# Patient Record
Sex: Male | Born: 1955 | Race: White | Hispanic: No | Smoking: Never smoker
Health system: Southern US, Community
[De-identification: ages and names within clinical notes are randomized; demographics above are authoritative.]

## PROBLEM LIST (undated history)

## (undated) DIAGNOSIS — I219 Acute myocardial infarction, unspecified: Secondary | ICD-10-CM

## (undated) HISTORY — PX: CHOLECYSTECTOMY: SHX55

## (undated) HISTORY — PX: TONSILLECTOMY: SUR1361

## (undated) HISTORY — PX: APPENDECTOMY: SHX54

---

## 2013-10-20 HISTORY — PX: CARDIAC CATHETERIZATION: SHX172

## 2019-11-03 ENCOUNTER — Other Ambulatory Visit: Payer: Self-pay

## 2019-11-04 ENCOUNTER — Other Ambulatory Visit: Payer: Self-pay

## 2019-11-09 DIAGNOSIS — E782 Mixed hyperlipidemia: Secondary | ICD-10-CM | POA: Insufficient documentation

## 2019-11-09 DIAGNOSIS — E039 Hypothyroidism, unspecified: Secondary | ICD-10-CM | POA: Insufficient documentation

## 2020-03-05 DIAGNOSIS — I10 Essential (primary) hypertension: Secondary | ICD-10-CM | POA: Insufficient documentation

## 2020-08-21 DIAGNOSIS — Z0000 Encounter for general adult medical examination without abnormal findings: Secondary | ICD-10-CM | POA: Insufficient documentation

## 2020-09-17 ENCOUNTER — Ambulatory Visit: Payer: Self-pay

## 2020-09-24 ENCOUNTER — Ambulatory Visit: Payer: Self-pay

## 2020-11-12 ENCOUNTER — Other Ambulatory Visit: Payer: Self-pay

## 2021-02-13 DIAGNOSIS — I25119 Atherosclerotic heart disease of native coronary artery with unspecified angina pectoris: Secondary | ICD-10-CM | POA: Insufficient documentation

## 2021-02-13 DIAGNOSIS — M545 Low back pain, unspecified: Secondary | ICD-10-CM | POA: Insufficient documentation

## 2021-02-13 DIAGNOSIS — G4733 Obstructive sleep apnea (adult) (pediatric): Secondary | ICD-10-CM | POA: Insufficient documentation

## 2021-02-13 DIAGNOSIS — K219 Gastro-esophageal reflux disease without esophagitis: Secondary | ICD-10-CM | POA: Insufficient documentation

## 2021-02-13 DIAGNOSIS — I2510 Atherosclerotic heart disease of native coronary artery without angina pectoris: Secondary | ICD-10-CM | POA: Insufficient documentation

## 2021-02-17 ENCOUNTER — Encounter: Payer: Self-pay | Admitting: Emergency Medicine

## 2021-02-17 ENCOUNTER — Emergency Department: Payer: BC Managed Care – PPO

## 2021-02-17 ENCOUNTER — Other Ambulatory Visit: Payer: Self-pay

## 2021-02-17 ENCOUNTER — Emergency Department
Admission: EM | Admit: 2021-02-17 | Discharge: 2021-02-17 | Disposition: A | Discharge status: Home / Self Care | Payer: BC Managed Care – PPO | Attending: Emergency Medicine | Admitting: Emergency Medicine

## 2021-02-17 DIAGNOSIS — E876 Hypokalemia: Secondary | ICD-10-CM | POA: Diagnosis not present

## 2021-02-17 DIAGNOSIS — I10 Essential (primary) hypertension: Secondary | ICD-10-CM | POA: Diagnosis not present

## 2021-02-17 DIAGNOSIS — R0602 Shortness of breath: Secondary | ICD-10-CM | POA: Diagnosis present

## 2021-02-17 DIAGNOSIS — U071 COVID-19: Secondary | ICD-10-CM | POA: Diagnosis not present

## 2021-02-17 DIAGNOSIS — I700 Atherosclerosis of aorta: Secondary | ICD-10-CM | POA: Insufficient documentation

## 2021-02-17 DIAGNOSIS — R911 Solitary pulmonary nodule: Secondary | ICD-10-CM

## 2021-02-17 HISTORY — DX: Acute myocardial infarction, unspecified: I21.9

## 2021-02-17 LAB — CBC WITH DIFFERENTIAL/PLATELET
Abs Immature Granulocytes: 0.01 10*3/uL (ref 0.00–0.07)
Basophils Absolute: 0 10*3/uL (ref 0.0–0.1)
Basophils Relative: 1 %
Eosinophils Absolute: 0.1 10*3/uL (ref 0.0–0.5)
Eosinophils Relative: 3 %
HCT: 47.4 % (ref 39.0–52.0)
Hemoglobin: 15.6 g/dL (ref 13.0–17.0)
Immature Granulocytes: 0 %
Lymphocytes Relative: 20 %
Lymphs Abs: 0.8 10*3/uL (ref 0.7–4.0)
MCH: 27.1 pg (ref 26.0–34.0)
MCHC: 32.9 g/dL (ref 30.0–36.0)
MCV: 82.4 fL (ref 80.0–100.0)
Monocytes Absolute: 0.4 10*3/uL (ref 0.1–1.0)
Monocytes Relative: 8 %
Neutro Abs: 2.8 10*3/uL (ref 1.7–7.7)
Neutrophils Relative %: 68 %
Platelets: 148 10*3/uL — ABNORMAL LOW (ref 150–400)
RBC: 5.75 MIL/uL (ref 4.22–5.81)
RDW: 16.7 % — ABNORMAL HIGH (ref 11.5–15.5)
WBC: 4.2 10*3/uL (ref 4.0–10.5)
nRBC: 0 % (ref 0.0–0.2)

## 2021-02-17 LAB — BASIC METABOLIC PANEL
Anion gap: 9 (ref 5–15)
BUN: 11 mg/dL (ref 8–23)
CO2: 24 mmol/L (ref 22–32)
Calcium: 8.4 mg/dL — ABNORMAL LOW (ref 8.9–10.3)
Chloride: 102 mmol/L (ref 98–111)
Creatinine, Ser: 1.07 mg/dL (ref 0.61–1.24)
GFR, Estimated: >60 mL/min (ref >60)
Glucose, Bld: 99 mg/dL (ref 70–99)
Potassium: 3.4 mmol/L — ABNORMAL LOW (ref 3.5–5.1)
Sodium: 135 mmol/L (ref 135–145)

## 2021-02-17 LAB — TROPONIN I (HIGH SENSITIVITY): Troponin I (High Sensitivity): 7 ng/L (ref <18)

## 2021-02-17 LAB — D-DIMER, QUANTITATIVE: D-Dimer, Quant: 0.63 ug/mL-FEU — ABNORMAL HIGH (ref 0.00–0.50)

## 2021-02-17 LAB — PROCALCITONIN: Procalcitonin: <0.1 ng/mL

## 2021-02-17 LAB — MAGNESIUM: Magnesium: 2 mg/dL (ref 1.7–2.4)

## 2021-02-17 IMAGING — CT CT ANGIO CHEST
2 of 6 series · 18 of 46 positions shown · IV contrast (APPLIED)
Comparison: No comparison studies available.

CLINICAL DATA: Shortness of breath.

EXAM:
CT ANGIOGRAPHY CHEST WITH CONTRAST
TECHNIQUE: Multidetector CT imaging of the chest was performed using the
standard protocol during bolus administration of intravenous
contrast. Multiplanar CT image reconstructions and MIPs were
obtained to evaluate the vascular anatomy.
CONTRAST:  100mL OMNIPAQUE IOHEXOL 350 MG/ML SOLN

[Series 6: thins · axial · 0.73mm/px · z∈[-241,+32]mm · 15 of 300 slices shown]
[im 14/300  lung]
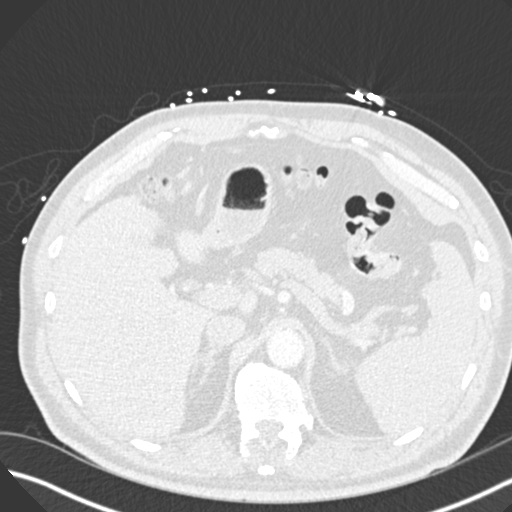
[im 40/300  soft-tissue]
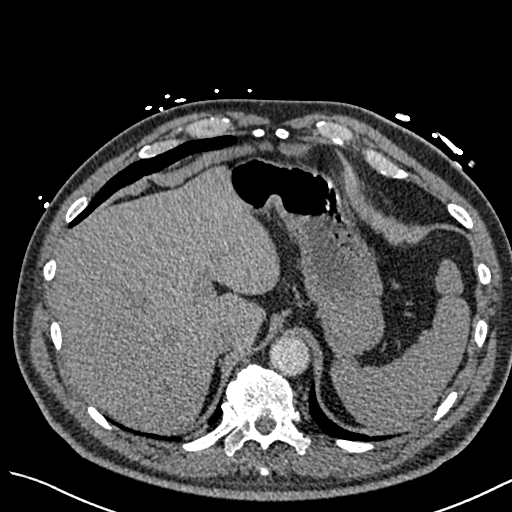
[im 53/300  lung]
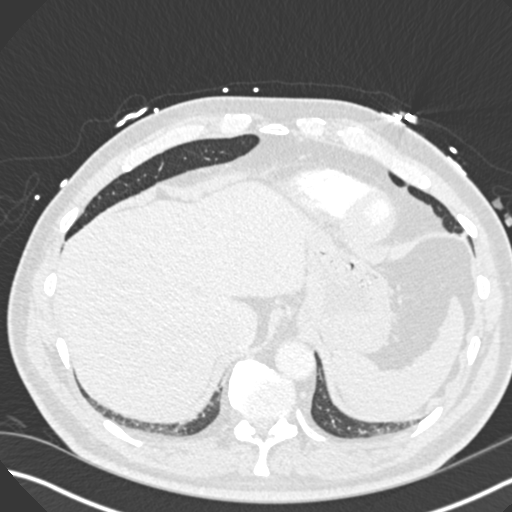
[im 79/300  soft-tissue]
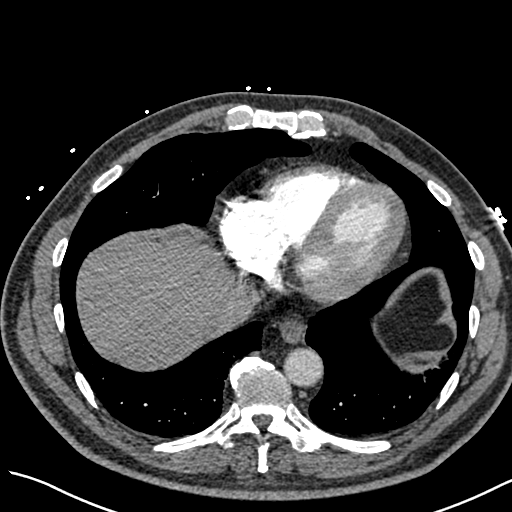
[im 92/300  lung]
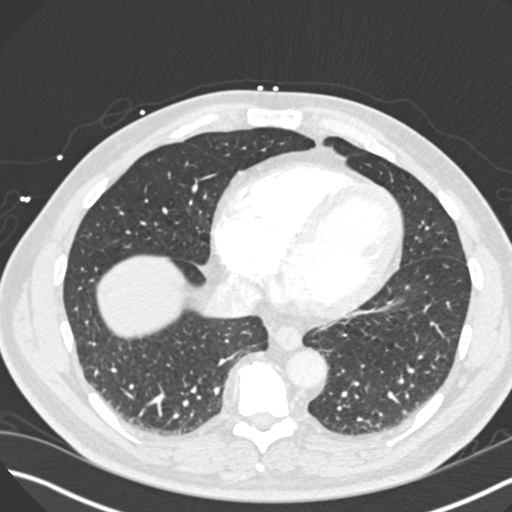
[im 118/300  soft-tissue]
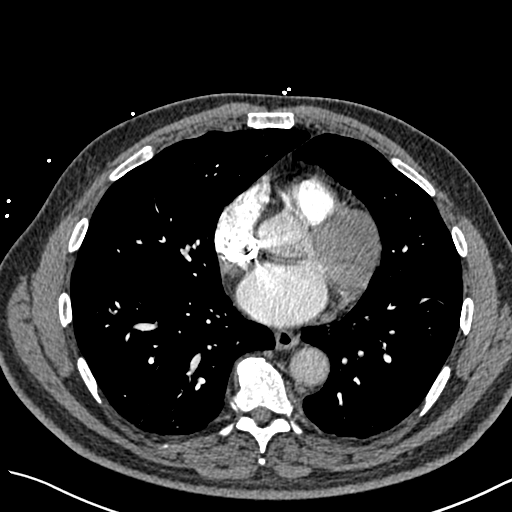
[im 131/300  lung]
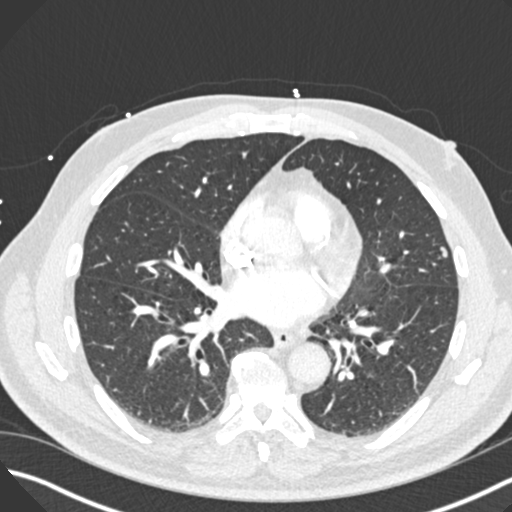
[im 157/300  soft-tissue]
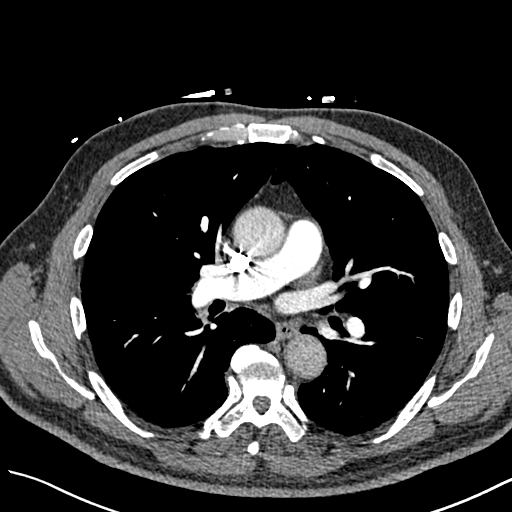
[im 170/300  lung]
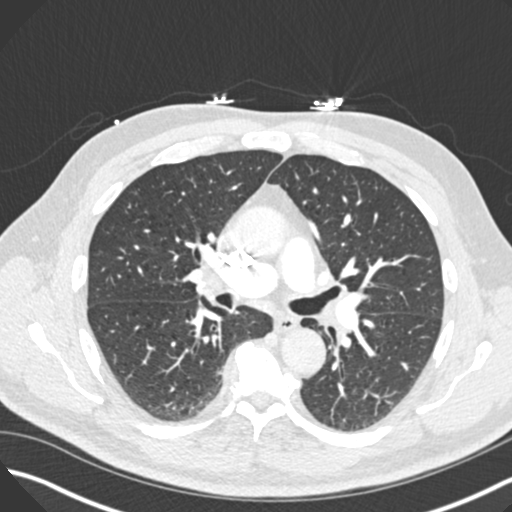
[im 183/300  soft-tissue]
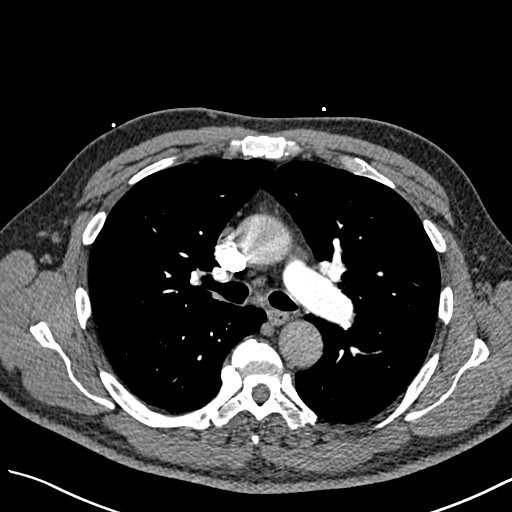
[im 209/300  lung]
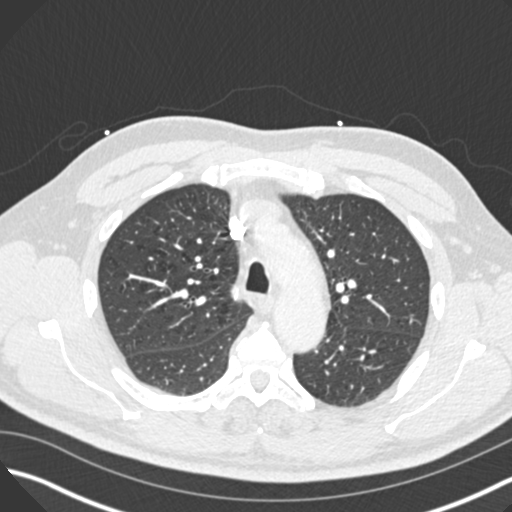
[im 222/300  soft-tissue]
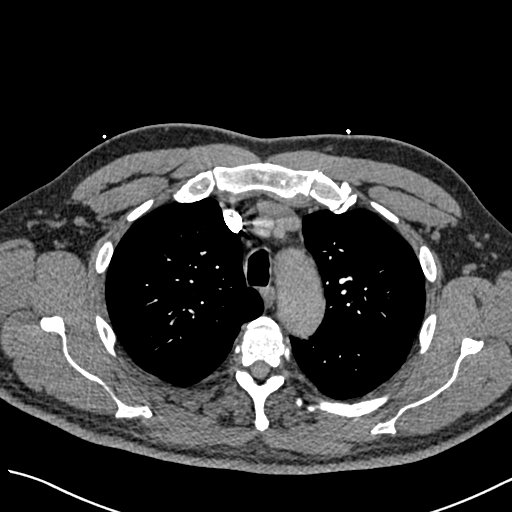
[im 248/300  lung]
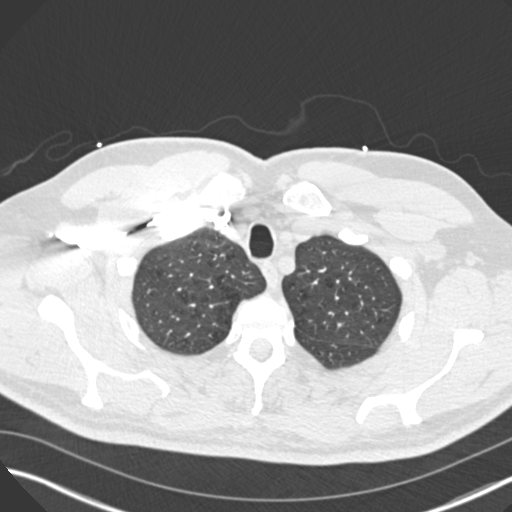
[im 261/300  soft-tissue]
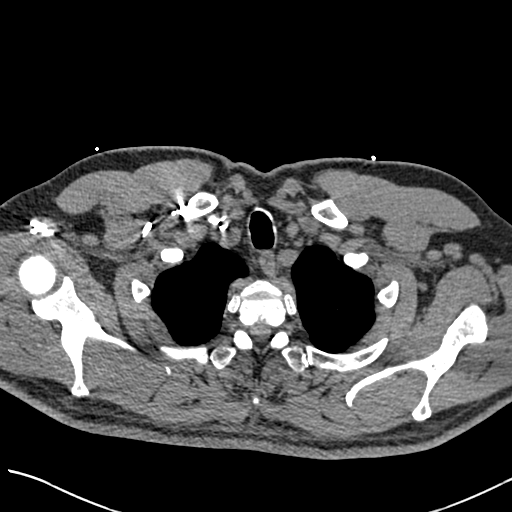
[im 287/300  lung]
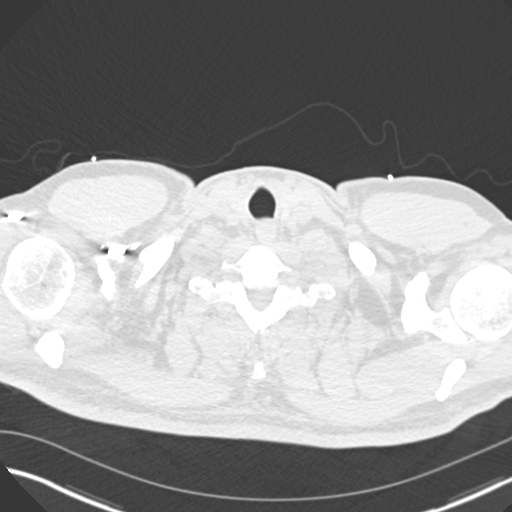

[Series 8: coronal mpr · coronal · 0.59mm/px · 3 of 99 slices shown]
[im 25/99  soft-tissue]
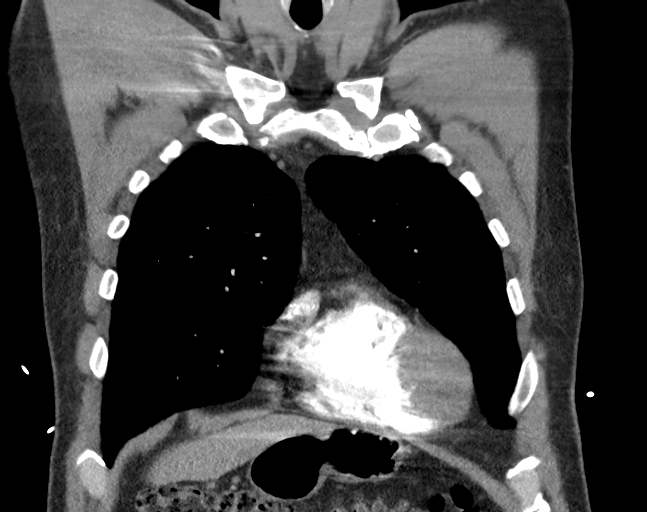
[im 50/99  soft-tissue]
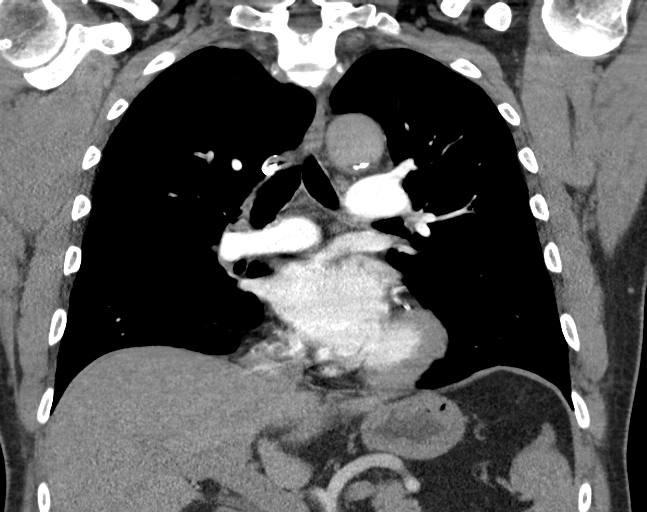
[im 74/99  soft-tissue]
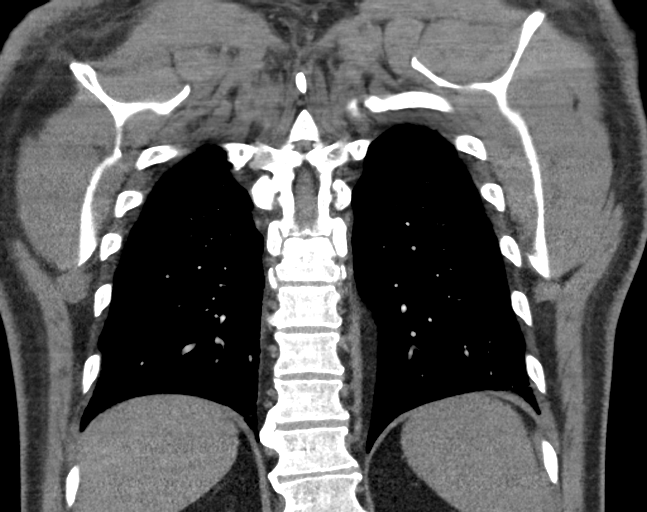

[18 of 46 positions shown; findings below may reference images not displayed]

FINDINGS: Cardiovascular: The heart size is normal. No substantial pericardial
effusion. Coronary artery calcification is evident. Atherosclerotic
calcification is noted in the wall of the thoracic aorta. There is
no filling defect within the opacified pulmonary arteries to suggest
the presence of an acute pulmonary embolus.

Mediastinum/Nodes: No mediastinal lymphadenopathy. There is no hilar
lymphadenopathy. The esophagus has normal imaging features. There is
no axillary lymphadenopathy.

Lungs/Pleura: Centrilobular emphsyema noted. 8 mm left upper lobe
nodule identified on image 57/7. Mixed attenuation left lower lobe
ground-glass nodule measuring 11 mm visible on 47/7. No focal
airspace consolidation. No pleural effusion.

Upper Abdomen: Unremarkable.

Musculoskeletal: No worrisome lytic or sclerotic osseous
abnormality.

Review of the MIP images confirms the above findings.
IMPRESSION: 1. No CT evidence for acute pulmonary embolus.
2. 8 mm left upper lobe pulmonary nodule with 11 mm mixed
attenuation left lower lobe ground-glass nodule. Non-contrast chest
CT at 6-12 months is recommended. If the left upper lobe nodule is
stable at time of repeat CT, then future CT at 18-24 months (from
today's scan) is considered optional for low-risk patients, but is
recommended for high-risk patients. Follow-up for the mixed
attenuation ground-glass nodule can be determined at the time of
initial follow-up study. This recommendation follows the consensus
statement: Guidelines for Management of Incidental Pulmonary Nodules
Detected on CT Images: From the [HOSPITAL] [5B]; Radiology
3. Aortic Atherosclerosis ([5B]-[5B]) and Emphysema ([5B]-[5B]).

## 2021-02-17 MED ORDER — ACETAMINOPHEN 500 MG PO TABS
1000.0000 mg | ORAL_TABLET | Freq: Once | ORAL | Status: AC
  Administered 2021-02-17: 1000 mg via ORAL
  Filled 2021-02-17: qty 2

## 2021-02-17 MED ORDER — IOHEXOL 350 MG/ML SOLN
100.0000 mL | Freq: Once | INTRAVENOUS | Status: AC | PRN
  Administered 2021-02-17: 100 mL via INTRAVENOUS

## 2021-02-17 MED ORDER — POTASSIUM CHLORIDE CRYS ER 20 MEQ PO TBCR
40.0000 meq | EXTENDED_RELEASE_TABLET | Freq: Once | ORAL | Status: AC
  Administered 2021-02-17: 40 meq via ORAL
  Filled 2021-02-17: qty 2

## 2021-02-17 MED ORDER — IBUPROFEN 400 MG PO TABS
400.0000 mg | ORAL_TABLET | Freq: Once | ORAL | Status: AC
  Administered 2021-02-17: 400 mg via ORAL
  Filled 2021-02-17: qty 1

## 2021-02-17 NOTE — ED Notes (Signed)
IV catheter removed intact without complication.  D/C instructions given.  Instructed on OTC med usage and follow up  per d/c orders.  All questions addressed.  Understanding verbalized. Pt left ER ambulatory with steady gait.

## 2021-02-17 NOTE — Discharge Instructions (Addendum)
Your Chest CT today showed: 1. No CT evidence for acute pulmonary embolus. 2. 8 mm left upper lobe pulmonary nodule with 11 mm mixed attenuation left lower lobe ground-glass nodule. Non-contrast chest CT at 6-12 months is recommended. If the left upper lobe nodule is stable at time of repeat CT, then future CT at 18-24 months (from today's scan) is considered optional for low-risk patients, but is recommended for high-risk patients. Follow-up for the mixed attenuation ground-glass nodule can be determined at the time of initial follow-up study. This recommendation follows the consensus statement: Guidelines for Management of Incidental Pulmonary Nodules Detected on CT Images: From the Fleischner Society 2017; Radiology 2017; 284:228-243. 3. Aortic Atherosclerosis (ICD10-I70.0) and Emphysema (ICD10-J43.9).

## 2021-02-17 NOTE — ED Provider Notes (Signed)
[facility] Emergency Department Provider Note  ____________________________________________   Event Date/Time   First MD Initiated Contact with Patient 02/17/21 [phone]     (approximate)  I have reviewed the triage vital signs and the nursing notes.   HISTORY  Chief Complaint Shortness of Breath and Covid Positive   HPI Jesse May is a 65 y.o. male with a past medical history of CAD, HDL and hypothyroidism who presents for assessment approximately 4 days of cough, shortness of breath, myalgias, headache, chills and some chest pain.  He describes his pain as tightness.  He states he was tested positive at urgent care on 4/28 and was told he would be better by now but feels he is not getting better.  He does not smoke and has been vaccinated.  He denies any rashes, nausea, vomiting, diarrhea, dysuria, abdominal pain, earache, sore throat, vision changes, recent falls or injuries or any other acute sick symptoms.  Has not taken any medicines for his symptoms today.         Past Medical History:  Diagnosis Date  . MI (myocardial infarction) (HCC)     There are no problems to display for this patient.     Prior to Admission medications   Not on File    Allergies Patient has no known allergies.  No family history on file.  Social History Social History   Tobacco Use  . Smoking status: Never Smoker  . Smokeless tobacco: Never Used  Substance Use Topics  . Alcohol use: Not Currently  . Drug use: Not Currently    Review of Systems  Review of Systems  Constitutional: Positive for malaise/fatigue. Negative for chills and fever.  HENT: Negative for sore throat.   Eyes: Negative for pain.  Respiratory: Positive for cough and shortness of breath. Negative for stridor.   Cardiovascular: Positive for chest pain.  Gastrointestinal: Negative for vomiting.  Genitourinary: Negative for dysuria.  Musculoskeletal: Positive for myalgias.  Skin:  Negative for rash.  Neurological: Negative for seizures, loss of consciousness and headaches.  Psychiatric/Behavioral: Negative for suicidal ideas.  All other systems reviewed and are negative.     ____________________________________________   PHYSICAL EXAM:  VITAL SIGNS: ED Triage Vitals  Enc Vitals Group     BP      Pulse      Resp      Temp      Temp src      SpO2      Weight      Height      Head Circumference      Peak Flow      Pain Score      Pain Loc      Pain Edu?      Excl. in GC?    Vitals:   02/17/21 0909 02/17/21 1015  BP: (!) 161/88 (!) 166/90  Pulse: 76 68  Resp: 18 18  Temp: 98.5 F (36.9 C)   SpO2: 100% 98%   Physical Exam Vitals and nursing note reviewed.  Constitutional:      Appearance: He is well-developed.  HENT:     Head: Normocephalic and atraumatic.     Right Ear: External ear normal.     Left Ear: External ear normal.     Nose: Nose normal.     Mouth/Throat:     Mouth: Mucous membranes are moist.  Eyes:     Conjunctiva/sclera: Conjunctivae normal.  Cardiovascular:     Rate and Rhythm: Normal  rate and regular rhythm.     Pulses: Normal pulses.     Heart sounds: No murmur heard.   Pulmonary:     Effort: Pulmonary effort is normal. No respiratory distress.     Breath sounds: Normal breath sounds.  Abdominal:     Palpations: Abdomen is soft.     Tenderness: There is no abdominal tenderness.  Musculoskeletal:     Cervical back: Neck supple.     Right lower leg: No edema.     Left lower leg: No edema.  Skin:    General: Skin is warm and dry.     Capillary Refill: Capillary refill takes less than 2 seconds.  Neurological:     Mental Status: He is alert and oriented to person, place, and time.  Psychiatric:        Mood and Affect: Mood normal.      ____________________________________________   LABS (all labs ordered are listed, but only abnormal results are displayed)  Labs Reviewed  CBC WITH DIFFERENTIAL/PLATELET  - Abnormal; Notable for the following components:      Result Value   RDW 16.7 (*)    Platelets 148 (*)    All other components within normal limits  BASIC METABOLIC PANEL - Abnormal; Notable for the following components:   Potassium 3.4 (*)    Calcium 8.4 (*)    All other components within normal limits  D-DIMER, QUANTITATIVE - Abnormal; Notable for the following components:   D-Dimer, Quant 0.63 (*)    All other components within normal limits  PROCALCITONIN  MAGNESIUM  TROPONIN I (HIGH SENSITIVITY)   ____________________________________________  EKG  Sinus rhythm with a ventricular rate of 75, incomplete left bundle branch block, otherwise unremarkable intervals without clear evidence of acute ischemia or significant underlying arrhythmia. ____________________________________________  RADIOLOGY  ED MD interpretation: Chest x-ray is unremarkable.  CTA chest shows no evidence of PE, full consolidation, overt edema, no thorax or other clear acute intrathoracic abnormalities.  There is evidence of nodule not previously seen and aortic atherosclerosis.  There is also evidence of some emphysema.  Official radiology report(s): DG Chest 2 View  Result Date: 02/17/2021 CLINICAL DATA:  Patient recently tested positive for COVID-19. Shortness of breath. EXAM: CHEST - 2 VIEW COMPARISON:  None. FINDINGS: The heart size and mediastinal contours are within normal limits. Both lungs are clear. The visualized skeletal structures are unremarkable. IMPRESSION: No active cardiopulmonary disease. Electronically Signed   By: Gerome Sam III M.D   On: 02/17/2021 09:43   CT Angio Chest PE W and/or Wo Contrast  Result Date: 02/17/2021 CLINICAL DATA:  Shortness of breath. EXAM: CT ANGIOGRAPHY CHEST WITH CONTRAST TECHNIQUE: Multidetector CT imaging of the chest was performed using the standard protocol during bolus administration of intravenous contrast. Multiplanar CT image reconstructions and MIPs were  obtained to evaluate the vascular anatomy. CONTRAST:  OMNIPAQUE IOHEXOL 350 MG/ML SOLN COMPARISON:  No comparison studies available. FINDINGS: Cardiovascular: The heart size is normal. No substantial pericardial effusion. Coronary artery calcification is evident. Atherosclerotic calcification is noted in the wall of the thoracic aorta. There is no filling defect within the opacified pulmonary arteries to suggest the presence of an acute pulmonary embolus. Mediastinum/Nodes: No mediastinal lymphadenopathy. There is no hilar lymphadenopathy. The esophagus has normal imaging features. There is no axillary lymphadenopathy. Lungs/Pleura: Centrilobular emphsyema noted. 8 mm left upper lobe nodule identified on image 57/7. Mixed attenuation left lower lobe ground-glass nodule measuring 11 mm visible on 47/7. No focal airspace  consolidation. No pleural effusion. Upper Abdomen: Unremarkable. Musculoskeletal: No worrisome lytic or sclerotic osseous abnormality. Review of the MIP images confirms the above findings. IMPRESSION: 1. No CT evidence for acute pulmonary embolus. 2. 8 mm left upper lobe pulmonary nodule with 11 mm mixed attenuation left lower lobe ground-glass nodule. Non-contrast chest CT at 6-12 months is recommended. If the left upper lobe nodule is stable at time of repeat CT, then future CT at 18-24 months (from today's scan) is considered optional for low-risk patients, but is recommended for high-risk patients. Follow-up for the mixed attenuation ground-glass nodule can be determined at the time of initial follow-up study. This recommendation follows the consensus statement: Guidelines for Management of Incidental Pulmonary Nodules Detected on CT Images: From the Fleischner Society 2017; Radiology 2017; 284:228-243. 3. Aortic Atherosclerosis (ICD10-I70.0) and Emphysema (ICD10-J43.9). Electronically Signed   By: Kennith CenterEric  Mansell M.D.   On: 02/17/2021 10:14     ____________________________________________   PROCEDURES  Procedure(s) performed (including Critical Care):  .1-3 Lead EKG Interpretation Performed by: Gilles ChiquitoSmith, Jerrid Forgette P, MD Authorized by: Gilles ChiquitoSmith, Shanan Mcmiller P, MD     Interpretation: normal     ECG rate assessment: normal     Rhythm: sinus rhythm     Ectopy: none     Conduction: normal       ____________________________________________   INITIAL IMPRESSION / ASSESSMENT AND PLAN / ED COURSE      Patient presents with above-stated history exam for assessment approximately 4 days of persistent cough, shortness of breath and chest pain after recent diagnosis of COVID-19 with patient being told by urgent care he should be better by now.  On arrival he is slightly hypertensive with a BP of 161/88 with otherwise stable vital signs on room air.  Differential includes persistent COVID-19 symptoms, ACS, myocarditis, pericarditis, PE, pleurisy, bacterial pneumonia, metabolic derangements and symptomatic anemia.  ECG shows no clear evidence of ischemia and given nonelevated troponin obtained.  3 hours after symptom onset Evalose patient for ACS or myocarditis.  CBC and BMP are unremarkable with the exception of K which is 3.4.  This was repleted.  Patient is WNL.  Pro-Cal is undetectable.  D-dimer is elevated 0.63 had a CTA obtained to rule out PE which does not show evidence of PE but does show findings consistent with COVID-pneumonia and a lung nodule which I advised patient of and recommendation to follow-up with his PCP who can order surveillance imaging as indicated.  Given otherwise reassuring exam and work-up with no evidence of hypoxia I think patient safe for discharge with outpatient follow-up.  Advised isolation for 10 days.  COVID referral placed.  Advised patient his blood pressure was elevated today and he follows up with his PCP in next couple days and recommend titration of his blood pressure medications.  Discharged stable  condition.  Strict return precautions advised and discussed.   ____________________________________________   FINAL CLINICAL IMPRESSION(S) / ED DIAGNOSES  Final diagnoses:  COVID  Hypokalemia  Lung nodule  Hypertension, unspecified type  Aortic atherosclerosis (HCC)    Medications  ibuprofen (ADVIL) tablet 400 mg (has no administration in time range)  acetaminophen (TYLENOL) tablet 1,000 mg (1,000 mg Oral Given 02/17/21 0937)  potassium chloride SA (KLOR-CON) CR tablet 40 mEq (40 mEq Oral Given 02/17/21 1020)  iohexol (OMNIPAQUE) 350 MG/ML injection 100 mL (100 mLs Intravenous Contrast Given 02/17/21 1006)     ED Discharge Orders         Ordered    Ambulatory referral for  Covid Treatment        02/17/21 1031           Note:  This document was prepared using Dragon voice recognition software and may include unintentional dictation errors.   Gilles Chiquito, MD 02/17/21 1034

## 2021-02-17 NOTE — ED Notes (Signed)
Called lab for Mag add-on.

## 2021-02-17 NOTE — ED Triage Notes (Signed)
Pt to ED via POV for shortness of breath. Pt states that he was diagnosed with COVID on Thursday and was told that he should be better by Sunday. Pt states that he is having increased shortness of breath and tightness in his chest. Pt appears to have increased WOB, SpO2 100% on room air

## 2021-02-18 ENCOUNTER — Telehealth: Payer: Self-pay | Admitting: Adult Health

## 2021-02-18 ENCOUNTER — Telehealth: Payer: Self-pay

## 2021-02-18 NOTE — Telephone Encounter (Signed)
Called to discuss with patient about COVID-19 symptoms and the use of one of the available treatments for those with mild to moderate Covid symptoms and at a high risk of hospitalization.  Pt appears to qualify for outpatient treatment due to co-morbid conditions and/or a member of an at-risk group in accordance with the FDA Emergency Use Authorization.    Symptom onset: 4/26 Vaccinated: yes Booster? no Immunocompromised? no Qualifiers: 4 NIH Criteria: 2  Patient declines driving to [place] to receive treatment.  He is outside of the window to receive oral therapy.  Reviewed supportive care and to go to the ER for further urgent needs.   Noreene Filbert

## 2021-02-18 NOTE — Telephone Encounter (Signed)
Called to discuss with patient about COVID-19 symptoms and the use of one of the available treatments for those with mild to moderate Covid symptoms and at a high risk of hospitalization.  Pt appears to qualify for outpatient treatment due to co-morbid conditions and/or a member of an at-risk group in accordance with the FDA Emergency Use Authorization.    Symptom onset: 02/13/21 Cough,shortness of breath,headache,chills Vaccinated: Yes Booster? Unknown Immunocompromised? Yes Qualifiers: CAD,MI NIH Criteria:   Unable to reach pt - Left message and call back number [phone].   Esther Hardy

## 2022-01-07 DIAGNOSIS — G4700 Insomnia, unspecified: Secondary | ICD-10-CM | POA: Insufficient documentation

## 2022-04-08 ENCOUNTER — Other Ambulatory Visit: Payer: Self-pay | Admitting: Nurse Practitioner

## 2022-04-08 DIAGNOSIS — M79661 Pain in right lower leg: Secondary | ICD-10-CM

## 2022-04-09 ENCOUNTER — Other Ambulatory Visit: Payer: Self-pay | Admitting: Nurse Practitioner

## 2022-04-09 DIAGNOSIS — R0600 Dyspnea, unspecified: Secondary | ICD-10-CM

## 2022-04-14 ENCOUNTER — Ambulatory Visit
Admission: RE | Admit: 2022-04-14 | Discharge: 2022-04-14 | Disposition: A | Discharge status: Home / Self Care | Payer: BC Managed Care – PPO | Source: Ambulatory Visit | Attending: Nurse Practitioner | Admitting: Nurse Practitioner

## 2022-04-14 DIAGNOSIS — M79661 Pain in right lower leg: Secondary | ICD-10-CM | POA: Insufficient documentation

## 2022-04-14 DIAGNOSIS — R0600 Dyspnea, unspecified: Secondary | ICD-10-CM | POA: Diagnosis present

## 2022-04-14 LAB — POCT I-STAT CREATININE: Creatinine, Ser: 1.2 mg/dL (ref 0.61–1.24)

## 2022-04-14 MED ORDER — IOHEXOL 350 MG/ML SOLN
75.0000 mL | Freq: Once | INTRAVENOUS | Status: AC | PRN
  Administered 2022-04-14: 75 mL via INTRAVENOUS

## 2022-04-15 ENCOUNTER — Ambulatory Visit
Admission: RE | Admit: 2022-04-15 | Discharge: 2022-04-15 | Disposition: A | Discharge status: Home / Self Care | Payer: BC Managed Care – PPO | Source: Ambulatory Visit | Attending: Nurse Practitioner | Admitting: Nurse Practitioner

## 2022-04-15 DIAGNOSIS — M79661 Pain in right lower leg: Secondary | ICD-10-CM

## 2022-04-15 DIAGNOSIS — R0600 Dyspnea, unspecified: Secondary | ICD-10-CM | POA: Diagnosis not present

## 2022-05-29 ENCOUNTER — Other Ambulatory Visit: Payer: Self-pay | Admitting: Nurse Practitioner

## 2022-05-29 DIAGNOSIS — M542 Cervicalgia: Secondary | ICD-10-CM

## 2022-05-29 DIAGNOSIS — M5440 Lumbago with sciatica, unspecified side: Secondary | ICD-10-CM

## 2022-07-08 ENCOUNTER — Other Ambulatory Visit: Payer: Self-pay | Admitting: Nurse Practitioner

## 2022-07-08 DIAGNOSIS — M549 Dorsalgia, unspecified: Secondary | ICD-10-CM

## 2022-07-15 ENCOUNTER — Ambulatory Visit
Admission: RE | Admit: 2022-07-15 | Discharge: 2022-07-15 | Disposition: A | Discharge status: Home / Self Care | Payer: BC Managed Care – PPO | Source: Ambulatory Visit | Attending: Nurse Practitioner | Admitting: Nurse Practitioner

## 2022-07-15 DIAGNOSIS — M549 Dorsalgia, unspecified: Secondary | ICD-10-CM | POA: Diagnosis not present

## 2022-07-17 DIAGNOSIS — N486 Induration penis plastica: Secondary | ICD-10-CM | POA: Insufficient documentation

## 2022-07-17 DIAGNOSIS — M4802 Spinal stenosis, cervical region: Secondary | ICD-10-CM | POA: Insufficient documentation

## 2022-07-18 ENCOUNTER — Other Ambulatory Visit: Payer: Self-pay | Admitting: Nurse Practitioner

## 2022-07-18 DIAGNOSIS — R937 Abnormal findings on diagnostic imaging of other parts of musculoskeletal system: Secondary | ICD-10-CM

## 2022-07-21 ENCOUNTER — Ambulatory Visit: Payer: BC Managed Care – PPO | Admitting: Urology

## 2022-07-30 ENCOUNTER — Ambulatory Visit
Admission: RE | Admit: 2022-07-30 | Discharge: 2022-07-30 | Disposition: A | Discharge status: Home / Self Care | Payer: BC Managed Care – PPO | Source: Ambulatory Visit | Attending: Nurse Practitioner | Admitting: Nurse Practitioner

## 2022-07-30 DIAGNOSIS — R937 Abnormal findings on diagnostic imaging of other parts of musculoskeletal system: Secondary | ICD-10-CM

## 2022-08-06 ENCOUNTER — Encounter: Payer: Self-pay | Admitting: Urology

## 2022-08-06 ENCOUNTER — Ambulatory Visit (INDEPENDENT_AMBULATORY_CARE_PROVIDER_SITE_OTHER): Payer: BC Managed Care – PPO | Admitting: Urology

## 2022-08-06 ENCOUNTER — Ambulatory Visit: Payer: BC Managed Care – PPO | Admitting: Urology

## 2022-08-06 VITALS — BP 176/80 | HR 80 | Ht 74.0 in | Wt 225.0 lb

## 2022-08-06 DIAGNOSIS — N486 Induration penis plastica: Secondary | ICD-10-CM | POA: Diagnosis not present

## 2022-08-06 MED ORDER — PENTOXIFYLLINE ER 400 MG PO TBCR: 400.0000 mg | EXTENDED_RELEASE_TABLET | Freq: Three times a day (TID) | ORAL | 2 refills | Status: DC

## 2022-08-06 NOTE — [place] Notes (Signed)
   08/06/2022 2:11 PM   Jesse May [birth year] [id number]  Referring provider: Danelle Berry, NP [address] [place],  [place] [address]  Chief Complaint  Patient presents with   Other    HPI: Jesse May is a 66 y.o. male referred for evaluation of Peyronie's disease.  2 month history of painful erection with curvature Notes leftward curvature towards the penile base which he estimates at 30 degrees No hourglass deformity No history of trauma/penile fracture No erectile dysfunction and able to have successful intercourse   PMH: Past Medical History:  Diagnosis Date   MI (myocardial infarction) ([place])     Surgical History: Past Surgical History:  Procedure Laterality Date   APPENDECTOMY     CHOLECYSTECTOMY     TONSILLECTOMY      Home Medications:  Allergies as of 08/06/2022   No Known Allergies      Medication List        Accurate as of August 06, 2022  2:11 PM. If you have any questions, ask your nurse or doctor.          STOP taking these medications    testosterone cypionate 200 MG/ML injection Commonly known as: DEPOTESTOSTERONE CYPIONATE Stopped by: Abbie Sons, MD   traZODone 50 MG tablet Commonly known as: DESYREL Stopped by: Abbie Sons, MD       TAKE these medications    Aspirin 81 MG Caps Aspirin 81 MG Oral Capsule QTY: 0 capsule Days: 0 Refills: 0  Written: 02/07/21 Patient Instructions:   losartan 50 MG tablet Commonly known as: COZAAR Losartan Potassium 50 MG Oral Tablet QTY: 90 tablet Days: 90 Refills: 3  Written: 05/29/22 Patient Instructions: Take 1 tablet by mouth daily in AM for BP        Allergies: No Known Allergies  Family History: No family history on file.  Social History:  reports that he has never smoked. He has never used smokeless tobacco. He reports that he does not currently use alcohol. He reports that he does not currently use drugs.   Physical Exam: BP (!) 176/80   Pulse 80   Ht 6\' 2"   (1.88 m)   Wt 225 lb (102.1 kg)   BMI 28.89 kg/m   Constitutional:  Alert and oriented, No acute distress. HEENT: [place] AT Respiratory: Normal respiratory effort, no increased work of breathing. GU: Phallus circumcised.  Dorsal plaque ~ 1.5 cm between base/mid shaft with some extension to the left lateral corpus.  Stretch penile length normal Skin: No rashes, bruises or suspicious lesions. Psychiatric: Normal mood and affect.   Assessment & Plan:    1.  Peyronie's disease We discussed the acute and chronic phases of Peyronie's disease and that he presently is in the acute phase We discussed surgery or Xiaflex is not recommended for acute phase Peyronie's disease He may have stable or worsening curvature though there are a certain percentage whose curvature will improve or resolve without treatment We discussed multiple off label treatments for Peyronie's disease.  He was interested in a 90-day trial of pentoxifylline Follow-up office visit 4 months   Abbie Sons, [place] [address], [place], [place] [address] [phone]

## 2022-10-01 ENCOUNTER — Encounter: Payer: Self-pay | Admitting: Urology

## 2022-10-01 ENCOUNTER — Ambulatory Visit (INDEPENDENT_AMBULATORY_CARE_PROVIDER_SITE_OTHER): Payer: BC Managed Care – PPO | Admitting: Urology

## 2022-10-01 VITALS — BP 130/80 | HR 74 | Ht 70.0 in | Wt 170.0 lb

## 2022-10-01 DIAGNOSIS — N486 Induration penis plastica: Secondary | ICD-10-CM

## 2022-10-02 ENCOUNTER — Encounter: Payer: Self-pay | Admitting: Urology

## 2022-10-02 NOTE — [place] Notes (Signed)
   10/01/2022 9:09 AM   Berdie Ogren [birth year] [id number]  Referring provider: Fayrene Helper, NP [address] [place],  [place] [address]  Chief Complaint  Patient presents with   Other    HPI: 66 y.o. male referred for follow-up of Peyronie's disease.  Initially seen 08/06/2022 with a 2 month history of painful erection with curvature, left curvature at the base estimated at 30 degrees Given a trial of pentoxifylline as he was still in acute phase though he had difficulty tolerating due to GI side effects. He is interested in pursuing Xiaflex however still having painful erections.  The curvature is stable   PMH: Past Medical History:  Diagnosis Date   MI (myocardial infarction) [facility])     Surgical History: Past Surgical History:  Procedure Laterality Date   APPENDECTOMY     CHOLECYSTECTOMY     TONSILLECTOMY      Home Medications:  Allergies as of 10/01/2022       Reactions   Gabapentin    Note: visual disturbances        Medication List        Accurate as of October 01, 2022 11:59 PM. If you have any questions, ask your nurse or doctor.          Aspirin 81 MG Caps Aspirin 81 MG Oral Capsule QTY: 0 capsule Days: 0 Refills: 0  Written: 02/07/21 Patient Instructions:   losartan 50 MG tablet Commonly known as: COZAAR Losartan Potassium 50 MG Oral Tablet QTY: 90 tablet Days: 90 Refills: 3  Written: 05/29/22 Patient Instructions: Take 1 tablet by mouth daily in AM for BP   pentoxifylline 400 MG CR tablet Commonly known as: TRENTAL Take 1 tablet (400 mg total) by mouth 3 (three) times daily with meals.   rosuvastatin 40 MG tablet Commonly known as: CRESTOR Take 40 mg by mouth daily.        Allergies:  Allergies  Allergen Reactions   Gabapentin     Note: visual disturbances    Family History: No family history on file.  Social History:  reports that he has never smoked. He has never used smokeless tobacco. He reports that he does not  currently use alcohol. He reports that he does not currently use drugs.   Physical Exam: BP 130/80   Pulse 74   Ht 5\' 10"  (1.778 m)   Wt 170 lb (77.1 kg)   BMI 24.39 kg/m   Constitutional:  Alert and oriented, No acute distress. HEENT: [place] AT Respiratory: Normal respiratory effort, no increased work of breathing. Psychiatric: Normal mood and affect.   Assessment & Plan:    1.  Peyronie's disease Still with painful erections and we discussed Xiaflex is not recommended for acute phase Peyronie's disease We reviewed Xiaflex treatment cycle with 2 injections over 1-3 days followed by modeling.  Potential side effects were discussed including pain/bruising and the possibility of corporal rupture which is increased with intercourse within the first 4 weeks of treatment.  We discussed a total of 4 treatment cycles may be needed He is interested in starting the approval process however recommend waiting until acute phase resolves   , MD  [facility] [address], [address] [place], [place] [phone]

## 2022-10-16 ENCOUNTER — Telehealth: Payer: Self-pay | Admitting: Family Medicine

## 2022-10-16 NOTE — Telephone Encounter (Signed)
Patient came by office and signed form. I faxed the form and have received approval. Waiting on medication to be delivered before scheduling for treatment.

## 2022-10-16 NOTE — Telephone Encounter (Signed)
-----   Message from [name], MD sent at 10/02/2022  9:14 AM EST ----- Regarding: Jesse May This is the patient interested in Xiaflex however still having pain with erections and will need to wait until acute phase resolves before his first cycle.  He was interested in going ahead and getting approved

## 2022-12-08 ENCOUNTER — Ambulatory Visit: Payer: Medicare Other | Admitting: Urology

## 2023-01-05 NOTE — Progress Notes (Unsigned)
   01/07/2023 9:01 AM  Jesse May [birth year] [id number]   Referring provider: No referring provider defined for this encounter.   Chief Complaint  Patient presents with   Abnormal Penile Curvature    HPI: Jesse May is a 67 y.o. male who presents today for induction of his erection for plaque marking and curvature measurements in preparation for his Xiaflex injection this afternoon.  The procedure is discussed with patient.  He is allowed to ask questions.  Questions were answered to his satisfaction.    Physical Exam:  Constitutional:  Well nourished. Alert and oriented, No acute distress. GU: No CVA tenderness.  No bladder fullness or masses.  Patient with circumcised phallus.  Urethral meatus is patent.  No penile discharge. No penile lesions or rashes. Scrotum without lesions, cysts, rashes and/or edema.  Psychiatric: Normal mood and affect.   Procedure  Patient's left corpus cavernosum is identified.  An area near the base of the penis is cleansed with rubbing alcohol.  Careful to avoid the dorsal vein, Caverject Impulse 20 mcg 0.5 Lot # [id number] exp # 2025 DEC 31 is injected at a 90 degree angle into the left  corpus cavernosum near the base of the penis.  Patient experienced penile fullness erection in 15 minutes.    Patient's right corpus cavernosum is identified.  An area near the base of the penis is cleansed with rubbing alcohol.  Careful to avoid the dorsal vein, Caverject Impulse 20 mcg 0.5 Lot # [id number] exp # 2025 DEC 31 is injected at a 90 degree angle into the right corpus cavernosum near the base of the penis.  Patient experienced a firm erection in 15 minutes.    Plaque marked.  40 degree dorsal curvature measured.      Assessment & Plan:    1.  Peyronie's disease -return this afternoon for Xiaflex injection      Return for this afternoon .  Jesse May  [place] [address] [place], [place]  [address] [phone]

## 2023-01-07 ENCOUNTER — Ambulatory Visit: Payer: BC Managed Care – PPO | Admitting: Urology

## 2023-01-07 ENCOUNTER — Encounter: Payer: Self-pay | Admitting: Urology

## 2023-01-07 VITALS — BP 164/83 | HR 71 | Ht 74.0 in | Wt 225.0 lb

## 2023-01-07 DIAGNOSIS — N486 Induration penis plastica: Secondary | ICD-10-CM

## 2023-01-07 MED ORDER — COLLAGENASE CLOSTRID HISTOLYT 0.9 MG IJ SOLR
0.9000 mg | Freq: Once | INTRAMUSCULAR | Status: AC
  Administered 2023-01-07: 0.9 mg via INTRAMUSCULAR

## 2023-01-07 NOTE — Progress Notes (Signed)
Xiaflex Injenction  Due to Peyronie's Disease Patient is present today for Xiaflex injection  Medication: Xiaflex 0.58 mg (0.39 ml Sterile diluent, 0.25 ml Reconstituted)   Location:  Lot: [id number] Exp: 10/2024  Patient tolerated well, no complications were noted  Performed by: DR. Bernardo Heater

## 2023-01-07 NOTE — Progress Notes (Signed)
Procedure: Xiaflex injection    Previously marked penile plaque site remained visible with marking pen.  The plaque was easily palpable on the dorsal shaft . Xiaflex injection (0.58 mg) was then injected directly into the plaque at the point of maximal curvature at an angle after  to being warmed to room temperature using a small TB needle.  He tolerated the procedure very well.    - Patient tolerated the procedure well - Discussed no intercourse/masturbation x 4 weeks - Follow-up tomorrow for second injection - Reassured bruising will be normal

## 2023-01-07 NOTE — Progress Notes (Signed)
Xiaflex modeling  Patient's Peyronie's plaque is identified and palpated on the phallus. Wearing gloves, I grasped the plaque about 1 cm proximal and distal to the injection site, making sure to avoid direct pressure on the injection site.  Using the target plaque as a fulcrum point, I used both hands to apply firm, steady pressure to elongate and stretch the plaque.  The penis was bent opposite to the patient's penile curvature, with stretching to the point of moderate resistance. I held pressure for 30 seconds then released. After a 60 second rest period, I repeated the penile modeling technique for a total of 3 modeling attempts at 30 seconds for each attempt.  We discussed penile strengthening and bending exercises at home for the next 6 weeks; instruction sheet provided to the patient. He tolerated well with no concerns.  

## 2023-01-08 ENCOUNTER — Encounter: Payer: Self-pay | Admitting: Urology

## 2023-01-08 ENCOUNTER — Ambulatory Visit (INDEPENDENT_AMBULATORY_CARE_PROVIDER_SITE_OTHER): Payer: BC Managed Care – PPO | Admitting: Urology

## 2023-01-08 VITALS — BP 138/70 | HR 74 | Ht 72.0 in | Wt 225.0 lb

## 2023-01-08 DIAGNOSIS — N486 Induration penis plastica: Secondary | ICD-10-CM

## 2023-01-08 MED ORDER — COLLAGENASE CLOSTRID HISTOLYT 0.9 MG IJ SOLR
0.9000 mg | Freq: Once | INTRAMUSCULAR | Status: AC
  Administered 2023-01-08: 0.9 mg via INTRAMUSCULAR

## 2023-01-08 NOTE — Progress Notes (Signed)
   Procedure: Xiaflex injection    Previously marked penile plaque site  Remained visible with marking pen.  The plaque was easily palpable on the mid shaft . Xiaflex injection (0.58 mg) was then injected directly into the plaque at the point of maximal curvature at an angle after being warmed to room temperature using a small TB needle.  He tolerated the procedure very well.    - Patient tolerated the procedure well -Scheduled for an office modeling tomorrow -At home modeling will be reviewed at tomorrow's visit -No intercourse/masturbation x 6 weeks -Follow-up office visit 6 weeks    John Giovanni, MD

## 2023-01-08 NOTE — Progress Notes (Signed)
Xiaflex Injenction   Due to Peyronie's Disease Patient is present today for Xiaflex injection   Medication: Xiaflex 0.58 mg (0.39 ml Sterile diluent, 0.25 ml Reconstituted)    Location:  Lot: [id number] Exp: 10/2024   Patient tolerated well, no complications were noted   Performed by: DR. Bernardo Heater

## 2023-01-09 ENCOUNTER — Ambulatory Visit (INDEPENDENT_AMBULATORY_CARE_PROVIDER_SITE_OTHER): Payer: BC Managed Care – PPO | Admitting: Urology

## 2023-01-09 VITALS — BP 155/8 | HR 73 | Ht 72.0 in | Wt 225.0 lb

## 2023-01-09 DIAGNOSIS — N486 Induration penis plastica: Secondary | ICD-10-CM

## 2023-01-19 ENCOUNTER — Ambulatory Visit: Payer: Managed Care, Other (non HMO) | Admitting: Nurse Practitioner

## 2023-01-23 ENCOUNTER — Other Ambulatory Visit: Payer: Managed Care, Other (non HMO)

## 2023-01-23 ENCOUNTER — Ambulatory Visit: Payer: BC Managed Care – PPO | Admitting: Nurse Practitioner

## 2023-01-23 DIAGNOSIS — E039 Hypothyroidism, unspecified: Secondary | ICD-10-CM

## 2023-01-23 DIAGNOSIS — E782 Mixed hyperlipidemia: Secondary | ICD-10-CM

## 2023-01-23 DIAGNOSIS — R7303 Prediabetes: Secondary | ICD-10-CM

## 2023-01-24 LAB — LIPID PANEL
Chol/HDL Ratio: 2.5 ratio (ref 0.0–5.0)
Cholesterol, Total: 92 mg/dL — ABNORMAL LOW (ref 100–199)
HDL: 37 mg/dL — ABNORMAL LOW (ref >39)
LDL Chol Calc (NIH): 41 mg/dL (ref 0–99)
Triglycerides: 64 mg/dL (ref 0–149)
VLDL Cholesterol Cal: 14 mg/dL (ref 5–40)

## 2023-01-24 LAB — CMP14+EGFR
ALT: 13 IU/L (ref 0–44)
AST: 15 IU/L (ref 0–40)
Albumin/Globulin Ratio: 2.3 — ABNORMAL HIGH (ref 1.2–2.2)
Albumin: 4.3 g/dL (ref 3.9–4.9)
Alkaline Phosphatase: 66 IU/L (ref 44–121)
BUN/Creatinine Ratio: 13 (ref 10–24)
BUN: 14 mg/dL (ref 8–27)
Bilirubin Total: 0.8 mg/dL (ref 0.0–1.2)
CO2: 22 mmol/L (ref 20–29)
Calcium: 9.2 mg/dL (ref 8.6–10.2)
Chloride: 100 mmol/L (ref 96–106)
Creatinine, Ser: 1.07 mg/dL (ref 0.76–1.27)
Globulin, Total: 1.9 g/dL (ref 1.5–4.5)
Glucose: 90 mg/dL (ref 70–99)
Potassium: 4 mmol/L (ref 3.5–5.2)
Sodium: 137 mmol/L (ref 134–144)
Total Protein: 6.2 g/dL (ref 6.0–8.5)
eGFR: 76 mL/min/{1.73_m2} (ref >59)

## 2023-01-24 LAB — HEMOGLOBIN A1C
Est. average glucose Bld gHb Est-mCnc: 105 mg/dL
Hgb A1c MFr Bld: 5.3 % (ref 4.8–5.6)

## 2023-01-24 LAB — TSH: TSH: 5.02 u[IU]/mL — ABNORMAL HIGH (ref 0.450–4.500)

## 2023-01-28 ENCOUNTER — Ambulatory Visit: Payer: BC Managed Care – PPO | Admitting: Nurse Practitioner

## 2023-02-06 ENCOUNTER — Ambulatory Visit: Payer: Managed Care, Other (non HMO) | Admitting: Nurse Practitioner

## 2023-02-09 ENCOUNTER — Ambulatory Visit (INDEPENDENT_AMBULATORY_CARE_PROVIDER_SITE_OTHER): Payer: Managed Care, Other (non HMO) | Admitting: Nurse Practitioner

## 2023-02-09 ENCOUNTER — Encounter: Payer: Self-pay | Admitting: Nurse Practitioner

## 2023-02-09 VITALS — BP 170/80 | HR 56 | Ht 74.0 in | Wt 210.0 lb

## 2023-02-09 DIAGNOSIS — R079 Chest pain, unspecified: Secondary | ICD-10-CM | POA: Diagnosis not present

## 2023-02-09 DIAGNOSIS — J0110 Acute frontal sinusitis, unspecified: Secondary | ICD-10-CM

## 2023-02-09 DIAGNOSIS — I209 Angina pectoris, unspecified: Secondary | ICD-10-CM | POA: Insufficient documentation

## 2023-02-09 DIAGNOSIS — K219 Gastro-esophageal reflux disease without esophagitis: Secondary | ICD-10-CM | POA: Diagnosis not present

## 2023-02-09 DIAGNOSIS — E039 Hypothyroidism, unspecified: Secondary | ICD-10-CM | POA: Diagnosis not present

## 2023-02-09 MED ORDER — AMOXICILLIN-POT CLAVULANATE 875-125 MG PO TABS
1.0000 | ORAL_TABLET | Freq: Two times a day (BID) | ORAL | 0 refills | Status: DC
Start: 2023-02-09 — End: 2023-03-27

## 2023-02-09 MED ORDER — PANTOPRAZOLE SODIUM 40 MG PO TBEC
40.0000 mg | DELAYED_RELEASE_TABLET | Freq: Every day | ORAL | 1 refills | Status: DC
Start: 2023-02-09 — End: 2023-07-31

## 2023-02-09 NOTE — Patient Instructions (Addendum)
1) Needs Cards follow up for chest pain 2) Follow up appt in 4 months, fasting labs prior 3) Mildly elevated TSH will be rechecked in 1 month

## 2023-02-09 NOTE — Progress Notes (Signed)
Established Patient Office Visit  Subjective:  Patient ID: Jesse May, male    DOB: [birth year]  Age: 67 y.o. MRN: [id number]  Chief Complaint  Patient presents with   Follow-up    4 month follow up, discuss lab results.    Follow up appt with fasting labs review.  BP elevated.  Has sinus headache this AM.      No other concerns at this time.   Past Medical History:  Diagnosis Date   MI (myocardial infarction)     Past Surgical History:  Procedure Laterality Date   APPENDECTOMY     CHOLECYSTECTOMY     TONSILLECTOMY      Social History   Socioeconomic History   Marital status: Divorced    Spouse name: Not on file   Number of children: Not on file   Years of education: Not on file   Highest education level: Not on file  Occupational History   Not on file  Tobacco Use   Smoking status: Never   Smokeless tobacco: Never  Substance and Sexual Activity   Alcohol use: Not Currently   Drug use: Not Currently   Sexual activity: Not on file  Other Topics Concern   Not on file  Social History Narrative   Not on file   Social Determinants of Health   Financial Resource Strain: Not on file  Food Insecurity: Not on file  Transportation Needs: Not on file  Physical Activity: Not on file  Stress: Not on file  Social Connections: Not on file  Intimate Partner Violence: Not on file    No family history on file.  Allergies  Allergen Reactions   Gabapentin     Note: visual disturbances    Review of Systems  Constitutional:  Positive for malaise/fatigue.  HENT: Negative.    Eyes: Negative.   Respiratory: Negative.    Cardiovascular: Negative.   Gastrointestinal: Negative.   Genitourinary: Negative.   Musculoskeletal: Negative.   Skin: Negative.   Neurological:  Positive for headaches.  Endo/Heme/Allergies: Negative.   Psychiatric/Behavioral: Negative.         Objective:   BP (!) 170/80   Pulse (!) 56   Ht  (1.88 m)   Wt 210 lb (95.3 kg)    SpO2 97%   BMI 26.96 kg/m   Vitals:   02/09/23 0950  BP: (!) 170/80  Pulse: (!) 56  Height:  (1.88 m)  Weight: 210 lb (95.3 kg)  SpO2: 97%  BMI (Calculated): 26.95    Physical Exam Vitals reviewed.  Constitutional:      Appearance: Normal appearance.  HENT:     Head: Normocephalic.     Nose: Congestion present.     Mouth/Throat:     Mouth: Mucous membranes are moist.  Eyes:     Pupils: Pupils are equal, round, and reactive to light.  Cardiovascular:     Rate and Rhythm: Normal rate and regular rhythm.  Pulmonary:     Effort: Pulmonary effort is normal.     Breath sounds: Normal breath sounds.  Abdominal:     General: Bowel sounds are normal.     Palpations: Abdomen is soft.  Musculoskeletal:        General: Normal range of motion.     Cervical back: Normal range of motion and neck supple.  Skin:    General: Skin is warm and dry.  Neurological:     Mental Status: He is alert and oriented to person, place, and  time.  Psychiatric:        Mood and Affect: Mood normal.        Behavior: Behavior normal.      No results found for any visits on 02/09/23.  Recent Results (from the past 2160 hour(s))  CMP14+EGFR     Status: Abnormal   Collection Time: 01/23/23 11:15 AM  Result Value Ref Range   Glucose 90 70 - 99 mg/dL   BUN 14 8 - 27 mg/dL   Creatinine, Ser 1.61 0.76 - 1.27 mg/dL   eGFR 76 >09 UE/AVW/0.98   BUN/Creatinine Ratio 13 10 - 24   Sodium 137 134 - 144 mmol/L   Potassium 4.0 3.5 - 5.2 mmol/L   Chloride 100 96 - 106 mmol/L   CO2 22 20 - 29 mmol/L   Calcium 9.2 8.6 - 10.2 mg/dL   Total Protein 6.2 6.0 - 8.5 g/dL   Albumin 4.3 3.9 - 4.9 g/dL   Globulin, Total 1.9 1.5 - 4.5 g/dL   Albumin/Globulin Ratio 2.3 (H) 1.2 - 2.2   Bilirubin Total 0.8 0.0 - 1.2 mg/dL   Alkaline Phosphatase 66 44 - 121 IU/L   AST 15 0 - 40 IU/L   ALT 13 0 - 44 IU/L  Lipid panel     Status: Abnormal   Collection Time: 01/23/23 11:15 AM  Result Value Ref Range    Cholesterol, Total 92 (L) 100 - 199 mg/dL   Triglycerides 64 0 - 149 mg/dL   HDL 37 (L) >11 mg/dL   VLDL Cholesterol Cal 14 5 - 40 mg/dL   LDL Chol Calc (NIH) 41 0 - 99 mg/dL   Chol/HDL Ratio 2.5 0.0 - 5.0 ratio    Comment:                                   T. Chol/HDL Ratio                                             Men  Women                               1/2 Avg.Risk  3.4    3.3                                   Avg.Risk  5.0    4.4                                2X Avg.Risk  9.6    7.1                                3X Avg.Risk 23.4   11.0   Hemoglobin A1c     Status: None   Collection Time: 01/23/23 11:15 AM  Result Value Ref Range   Hgb A1c MFr Bld 5.3 4.8 - 5.6 %    Comment:          Prediabetes: 5.7 - 6.4          Diabetes: >6.4  Glycemic control for adults with diabetes: <7.0    Est. average glucose Bld gHb Est-mCnc 105 mg/dL  TSH     Status: Abnormal   Collection Time: 01/23/23 11:15 AM  Result Value Ref Range   TSH 5.020 (H) 0.450 - 4.500 uIU/mL      Assessment & Plan:   Problem List Items Addressed This Visit   None   No follow-ups on file.   Total time spent: 35 minutes  Orson Eva, NP  02/09/2023

## 2023-02-09 NOTE — Addendum Note (Signed)
Addended by: Orson Eva on: 02/09/2023 10:23 AM   Modules accepted: Orders

## 2023-02-13 ENCOUNTER — Ambulatory Visit (INDEPENDENT_AMBULATORY_CARE_PROVIDER_SITE_OTHER): Payer: Managed Care, Other (non HMO) | Admitting: Cardiovascular Disease

## 2023-02-13 ENCOUNTER — Ambulatory Visit (INDEPENDENT_AMBULATORY_CARE_PROVIDER_SITE_OTHER): Payer: Managed Care, Other (non HMO) | Admitting: Urology

## 2023-02-13 ENCOUNTER — Encounter: Payer: Self-pay | Admitting: Cardiovascular Disease

## 2023-02-13 ENCOUNTER — Encounter: Payer: Self-pay | Admitting: Urology

## 2023-02-13 VITALS — BP 162/82 | HR 67 | Ht 74.0 in | Wt 210.0 lb

## 2023-02-13 VITALS — BP 140/90 | HR 64 | Ht 74.0 in | Wt 208.8 lb

## 2023-02-13 DIAGNOSIS — I10 Essential (primary) hypertension: Secondary | ICD-10-CM

## 2023-02-13 DIAGNOSIS — I25110 Atherosclerotic heart disease of native coronary artery with unstable angina pectoris: Secondary | ICD-10-CM

## 2023-02-13 DIAGNOSIS — N486 Induration penis plastica: Secondary | ICD-10-CM

## 2023-02-13 DIAGNOSIS — R079 Chest pain, unspecified: Secondary | ICD-10-CM

## 2023-02-13 NOTE — Progress Notes (Signed)
I, Jesse May,acting as a scribe for Jesse Altes, MD.,have documented all relevant documentation on the behalf of Jesse Altes, MD,as directed by  Jesse Altes, MD while in the presence of Jesse Altes, MD.   I, [name],acting as a scribe for Jesse Altes, MD.,have documented all relevant documentation on the behalf of Jesse Altes, MD,as directed by  Jesse Altes, MD while in the presence of Jesse Altes, MD.   02/13/2023 11:46 AM   Jesse May 03-26-1956 [id number]  Referring provider: Orson Eva, NP [address] [place],  [place] [address]  Chief Complaint  Patient presents with   Abnormal Penile Curvature   Urologic history: Peyronie's disease 2 month history of painful erection with curvature, left curvature at the base estimated at 30 degrees.   HPI: Jesse May is a 67 y.o. male here for follow-up visit.  First cycle of Xiaflex completed 01/08/23.  He did note slight improvement in his curvature after the first cycle, and is interested in scheduling cycle 2.   PMH: Past Medical History:  Diagnosis Date   MI (myocardial infarction) [facility])     Surgical History: Past Surgical History:  Procedure Laterality Date   APPENDECTOMY     CHOLECYSTECTOMY     TONSILLECTOMY      Home Medications:  Allergies as of 02/13/2023       Reactions   Gabapentin    Note: visual disturbances        Medication List        Accurate as of February 13, 2023 11:46 AM. If you have any questions, ask your nurse or doctor.          amoxicillin-clavulanate 875-125 MG tablet Commonly known as: AUGMENTIN Take 1 tablet by mouth 2 (two) times daily.   Aspirin 81 MG Caps Aspirin 81 MG Oral Capsule QTY: 0 capsule Days: 0 Refills: 0  Written: 02/07/21 Patient Instructions:   levothyroxine 137 MCG tablet Commonly known as: SYNTHROID Take 137 mcg by mouth daily before breakfast.   losartan 50 MG tablet Commonly known as: COZAAR Losartan  Potassium 50 MG Oral Tablet QTY: 90 tablet Days: 90 Refills: 3  Written: 05/29/22 Patient Instructions: Take 1 tablet by mouth daily in AM for BP   pantoprazole 40 MG tablet Commonly known as: PROTONIX Take 1 tablet (40 mg total) by mouth daily.   pentoxifylline 400 MG CR tablet Commonly known as: TRENTAL Take 1 tablet (400 mg total) by mouth 3 (three) times daily with meals.   rosuvastatin 40 MG tablet Commonly known as: CRESTOR Take 40 mg by mouth daily.        Allergies:  Allergies  Allergen Reactions   Gabapentin     Note: visual disturbances    Social History:  reports that he has never smoked. He has never used smokeless tobacco. He reports that he does not currently use alcohol. He reports that he does not currently use drugs.   Physical Exam: BP (!) 162/82   Pulse 67   Ht 6\' 2"  (1.88 m)   Wt 210 lb (95.3 kg)   BMI 26.96 kg/m   Constitutional:  Alert and oriented, No acute distress. HEENT: [place] AT, moist mucus membranes.  Trachea midline, no masses. Cardiovascular: No clubbing, cyanosis, or edema. Respiratory: Normal respiratory effort, no increased work of breathing. GI: Abdomen is soft, nontender, nondistended, no abdominal masses Skin: No rashes, bruises or suspicious lesions. Neurologic: Grossly intact, no focal deficits, moving  all 4 extremities. Psychiatric: Normal mood and affect.  Assessment & Plan:    Peyronie's disease Would like to schedule cycle 2 of Xiaflex.  [facility] [address], [address] [place], [place] [address] [phone]

## 2023-02-13 NOTE — Progress Notes (Unsigned)
[facility] presents for an office/procedure visit. BP today is __4/26/2024_________. He is complaint with BP medication. Greater than 140/90. Provider  notified. Pt advised to__see PCP. Pt voiced understanding.

## 2023-02-13 NOTE — Assessment & Plan Note (Signed)
Patient complaining of stabbing chest pain, left chest area. Had cardiac cath approximately 10 years ago in [place]. Patient presented with vision changes. EKG today unchanged from 02/2021 EKG. Stress test 02/2021 Ischemia in the LAD territory with normal LVEF, advise CCTA. Will order repeat echo to check heart function. CCTA to check stent patency.

## 2023-02-13 NOTE — Progress Notes (Signed)
Cardiology Office Note   Date:  02/13/2023   ID:  [name], DOB Jan 11, 1956, MRN [id number]  PCP:  Orson Eva, NP  Cardiologist:  Adrian Blackwater, MD      History of Present Illness: Jesse May is a 67 y.o. male who presents for No chief complaint on file.   Patient in office to establish care. Has been experiencing chest pain.   Chest Pain  This is a chronic problem. The current episode started more than 1 month ago. The problem occurs every several days. The problem has been gradually worsening. The pain is present in the lateral region. The quality of the pain is described as stabbing. The pain does not radiate. Associated symptoms include shortness of breath. Risk factors include being elderly and male gender.    Past Medical History:  Diagnosis Date   MI (myocardial infarction) (HCC)      Past Surgical History:  Procedure Laterality Date   APPENDECTOMY     CHOLECYSTECTOMY     TONSILLECTOMY       Current Outpatient Medications  Medication Sig Dispense Refill   amoxicillin-clavulanate (AUGMENTIN) 875-125 MG tablet Take 1 tablet by mouth 2 (two) times daily. 20 tablet 0   Aspirin 81 MG CAPS Aspirin 81 MG Oral Capsule QTY: 0 capsule Days: 0 Refills: 0  Written: 02/07/21 Patient Instructions:     levothyroxine (SYNTHROID) 137 MCG tablet Take 137 mcg by mouth daily before breakfast.     losartan (COZAAR) 50 MG tablet Losartan Potassium 50 MG Oral Tablet QTY: 90 tablet Days: 90 Refills: 3  Written: 05/29/22 Patient Instructions: Take 1 tablet by mouth daily in AM for BP     pantoprazole (PROTONIX) 40 MG tablet Take 1 tablet (40 mg total) by mouth daily. 90 tablet 1   rosuvastatin (CRESTOR) 40 MG tablet Take 40 mg by mouth daily.     No current facility-administered medications for this visit.    Allergies:   Gabapentin    Social History:   reports that he has never smoked. He has never used smokeless tobacco. He reports that he does not currently use alcohol. He  reports that he does not currently use drugs.   Family History:  family history is not on file.    ROS:     Review of Systems  Constitutional: Negative.   HENT: Negative.    Eyes: Negative.   Respiratory:  Positive for shortness of breath.   Cardiovascular:  Positive for chest pain.  Gastrointestinal: Negative.   Genitourinary: Negative.   Musculoskeletal: Negative.   Skin: Negative.   Neurological: Negative.   Endo/Heme/Allergies: Negative.   Psychiatric/Behavioral: Negative.    All other systems reviewed and are negative.   All other systems are reviewed and negative.   PHYSICAL EXAM: VS:  BP (!) 140/90   Pulse 64   Ht 6\' 2"  (1.88 m)   Wt 208 lb 12.8 oz (94.7 kg)   SpO2 98%   BMI 26.81 kg/m  , BMI Body mass index is 26.81 kg/m. Last weight:  Wt Readings from Last 3 Encounters:  02/13/23 208 lb 12.8 oz (94.7 kg)  02/13/23 210 lb (95.3 kg)  02/09/23 210 lb (95.3 kg)    Physical Exam Vitals reviewed.  Constitutional:      Appearance: Normal appearance. He is normal weight.  HENT:     Head: Normocephalic.     Nose: Nose normal.     Mouth/Throat:     Mouth: Mucous membranes are moist.  Eyes:     Pupils: Pupils are equal, round, and reactive to light.  Cardiovascular:     Rate and Rhythm: Normal rate and regular rhythm.     Pulses: Normal pulses.     Heart sounds: Normal heart sounds.  Pulmonary:     Effort: Pulmonary effort is normal.  Abdominal:     General: Abdomen is flat. Bowel sounds are normal.  Musculoskeletal:        General: Normal range of motion.     Cervical back: Normal range of motion.  Skin:    General: Skin is warm.  Neurological:     General: No focal deficit present.     Mental Status: He is alert.  Psychiatric:        Mood and Affect: Mood normal.     EKG: sinus bradycardia, incomplete RBBB, HR 53 bpm, non-specific ST changes  Recent Labs: 01/23/2023: ALT 13; BUN 14; Creatinine, Ser 1.07; Potassium 4.0; Sodium 137; TSH 5.020     Lipid Panel    Component Value Date/Time   CHOL 92 (L) 01/23/2023 1115   TRIG 64 01/23/2023 1115   HDL 37 (L) 01/23/2023 1115   CHOLHDL 2.5 01/23/2023 1115   LDLCALC 41 01/23/2023 1115      Other studies Reviewed: Patient: [id number] - Jesse May DOB:  1956-09-21  Date:  03/15/2021 08:00 Provider: Adrian Blackwater MD Encounter: NUCLEAR STRESS TEST   Page 2 TESTS                                                                                          [facility] [address] [place], [place] [address] [phone] STUDY:  Gated Stress / Rest Myocardial Perfusion Imaging Tomographic (SPECT) Including attenuation correction Wall Motion, Left Ventricular Ejection Fraction By Gated Technique.Treadmill Stress Test. SEX:  Male   WEIGHT: 235 lbs  HEIGHT: 74 in       ARMS UP: YES/NO                                                                        REFERRING PHYSICIAN: Dr.Xyler Terpening Welton Flakes  INDICATION FOR STUDY: CP                                                                                                                                                                                                                    TECHNIQUE:  Approximately 20 minutes following the intravenous administration of 10.4 mCi of Tc-46m Sestamibi after stress testing in a reclined supine position with arms above their head if able to do so, gated SPECT imaging of the heart was performed. After about a 2hr break, the patient was injected intravenously with 32.0 mCi of Tc-49m Sestamibi.  Approximately 45 minutes later in the same position as stress imaging SPECT rest imaging of the heart was performed.  STRESS BY:  Adrian Blackwater, MD PROTOCOL:   Smitty Cords                                                                                        MAX PRED HR: 154                     85%: 131               75%: 116                                                                                                                   RESTING BP: 126/82   RESTING HR: 68  PEAK BP: 148/90  PEAK HR: 110 (71%)  EXERCISE DURATION: 8:49                                             METS: 10.1     REASON FOR TEST TERMINATION: Fatigue. SOB. Lightheaded.                                                                                                                                 SYMPTOMS: Fatigue. SOB. Lightheaded. DUKE TREADMILL SCORE: 9                                       RISK:  Low                                                                                                                                                                                                           EKG RESULTS: NSR. 70/min. No significant ST changes at peak exercise.                                                              IMAGE QUALITY: Good  PERFUSION/WALL MOTION FINDINGS: EF = 70%. Large severe reversible basal inferoseptal, basal inferior, mid inferoseptal, mid inferior, and apex (17) wall defects, normal wall motion.                                                                           IMPRESSION:   Ischemia in the LAD territory with normal LVEF, advise CCTA.                                                                                                                                                                                                                                                                                        Adrian Blackwater, MD Stress Interpreting Physician / Nuclear Interpreting Physician  Adrian Blackwater MD  Electronically signed by: Adrian Blackwater     Date: 03/25/2021 10:51  Patient: [id number] - Jesse May DOB:  09/27/1956  Date:  03/04/2021 15:00 Provider: Adrian Blackwater MD Encounter: ECHO   Page 2 REASON FOR VISIT  Visit for: Echocardiogram/Chest pain unspecified  Sex:   Male       wt=  228  lbs.  BP=136/70  Height=  74  inches.   TESTS  Imaging: Echocardiogram:  An echocardiogram in (2-d) mode was performed and in Doppler mode with color flow velocity mapping was performed. The aortic valve cusps are abnormal 2.1   cm, flow velocity 1.6  m/s, and systolic calculated mean flow gradient 5  mmHg. Mitral valve diastolic peak flow velocity E 0.8    m/s and E/A ratio 0.8. Aortic root diameter 3.6   cm. The LVOT internal diameter 2  cm and flow velocity was abnormal 1.1  m/s. LV systolic dimension 3.1   cm, diastolic 4.2  cm, posterior wall thickness 1.1   cm, fractional shortening 25 %, and EF 65 %. IVS thickness 1.1  cm. LA dimension 4.2cm  RIGHT atrium=  16   cm2. Mitral Valve =  Ea=9.7  DT= 215 msec. Aortic Valve is Normal. Pulmonic Valve= PIEDV=   1.1   m/s. Mitral Valve has Mild Regurgitation. Tricuspid Valve is Normal. Pulmonic Valve has Mild Regurgitation.   ASSESSMENT  Technically adequate study.  Ejection fraction-65  Left Ventricle- Normal size   Left Ventricle diastolic dysfunction grade-Grade 1 relaxation abnormality  Right Ventricle- Mildly dilated, normal  function  Normal right ventricular wall motion  Left Atrium-Mildly dilated  Right Atrium-Normal size  Aortic valve-Trivial calcification of aortic valve cusps with No stenosis, No regurgitation  Pulmonic Valve-No stenosis, Mild regurgitation  Mitral Valve-Mild regurgitation  Tricuspid Valve-No Regurgitation  No  Pericardial effusion.   THERAPY   Referring physician: Laurier Nancy  Sonographer: Lenor Derrick.   Adrian Blackwater MD  Electronically signed by: Adrian Blackwater     Date: 03/05/2021 11:16   ASSESSMENT AND PLAN:    ICD-10-CM   1. Chest pain, unspecified type  R07.9 PCV ECHOCARDIOGRAM COMPLETE    CT CORONARY MORPH W/CTA COR W/SCORE W/CA W/CM &/OR WO/CM    2. Benign essential HTN  I10     3. Atherosclerosis of native coronary artery of native heart with unstable angina pectoris (HCC)  I25.110        Problem List Items Addressed This Visit       Cardiovascular and Mediastinum   Benign essential HTN   Coronary atherosclerosis of native coronary artery     Other   Chest pain - Primary    Patient complaining of stabbing chest pain, left chest area. Had cardiac cath approximately 10 years ago in [place]. Patient presented with vision changes. EKG today unchanged from 02/2021 EKG. Stress test 02/2021 Ischemia in the LAD territory with normal LVEF, advise CCTA. Will order repeat echo to check heart function. CCTA to check stent patency.       Relevant Orders   PCV ECHOCARDIOGRAM COMPLETE   CT CORONARY MORPH W/CTA COR W/SCORE W/CA W/CM &/OR WO/CM       Disposition:   Return in about 4 weeks (around 03/13/2023) for after CCTA, echo.    Total time spent: 30 minutes  Signed,  Adrian Blackwater, MD  02/13/2023 1:51 PM    [facility]

## 2023-03-03 ENCOUNTER — Other Ambulatory Visit: Payer: Managed Care, Other (non HMO)

## 2023-03-05 ENCOUNTER — Ambulatory Visit (INDEPENDENT_AMBULATORY_CARE_PROVIDER_SITE_OTHER): Payer: Managed Care, Other (non HMO)

## 2023-03-05 DIAGNOSIS — R072 Precordial pain: Secondary | ICD-10-CM

## 2023-03-05 DIAGNOSIS — R079 Chest pain, unspecified: Secondary | ICD-10-CM

## 2023-03-05 MED ORDER — IOHEXOL 350 MG/ML SOLN
100.0000 mL | Freq: Once | INTRAVENOUS | Status: AC | PRN
Start: 2023-03-05 — End: 2023-03-05
  Administered 2023-03-05: 100 mL via INTRAVENOUS

## 2023-03-06 ENCOUNTER — Ambulatory Visit (INDEPENDENT_AMBULATORY_CARE_PROVIDER_SITE_OTHER): Payer: Managed Care, Other (non HMO)

## 2023-03-06 DIAGNOSIS — I340 Nonrheumatic mitral (valve) insufficiency: Secondary | ICD-10-CM

## 2023-03-06 DIAGNOSIS — I361 Nonrheumatic tricuspid (valve) insufficiency: Secondary | ICD-10-CM

## 2023-03-06 DIAGNOSIS — R079 Chest pain, unspecified: Secondary | ICD-10-CM

## 2023-03-12 ENCOUNTER — Telehealth: Payer: Self-pay | Admitting: *Deleted

## 2023-03-12 NOTE — Telephone Encounter (Signed)
Talked with patient and Lyla Son is working with the lady from [place].

## 2023-03-12 NOTE — Telephone Encounter (Signed)
Pt calling triage stating that he has gotten a denial on [employer], pt asking what is the next step.

## 2023-03-13 ENCOUNTER — Ambulatory Visit: Payer: Managed Care, Other (non HMO) | Admitting: Cardiovascular Disease

## 2023-03-13 ENCOUNTER — Encounter: Payer: Self-pay | Admitting: Cardiovascular Disease

## 2023-03-13 ENCOUNTER — Ambulatory Visit (INDEPENDENT_AMBULATORY_CARE_PROVIDER_SITE_OTHER): Payer: Managed Care, Other (non HMO) | Admitting: Cardiovascular Disease

## 2023-03-13 VITALS — BP 138/80 | HR 72 | Ht 74.0 in | Wt 202.0 lb

## 2023-03-13 DIAGNOSIS — I25110 Atherosclerotic heart disease of native coronary artery with unstable angina pectoris: Secondary | ICD-10-CM | POA: Diagnosis not present

## 2023-03-13 DIAGNOSIS — I10 Essential (primary) hypertension: Secondary | ICD-10-CM

## 2023-03-13 DIAGNOSIS — E782 Mixed hyperlipidemia: Secondary | ICD-10-CM | POA: Diagnosis not present

## 2023-03-13 DIAGNOSIS — R0789 Other chest pain: Secondary | ICD-10-CM

## 2023-03-13 MED ORDER — METOPROLOL SUCCINATE ER 25 MG PO TB24: 25.0000 mg | ORAL_TABLET | Freq: Every day | ORAL | 1 refills | Status: DC

## 2023-03-13 MED ORDER — CLOPIDOGREL BISULFATE 75 MG PO TABS
75.0000 mg | ORAL_TABLET | Freq: Every day | ORAL | 11 refills | Status: AC
Start: 2023-03-13 — End: 2024-03-12

## 2023-03-13 MED ORDER — ISOSORBIDE MONONITRATE ER 30 MG PO TB24
30.0000 mg | ORAL_TABLET | Freq: Every day | ORAL | 1 refills | Status: DC
Start: 2023-03-13 — End: 2023-05-21

## 2023-03-13 NOTE — Assessment & Plan Note (Signed)
Chest pain less often but still present.

## 2023-03-13 NOTE — Assessment & Plan Note (Addendum)
Patient continues to have chest pain over left breast. CCTA 02/2023 RCA has no significant disease. Mid LAD is severely calcified with 70% disease. LCX is calcified with mild diffuse disease. Consider Cath. Add isosorbide, metoprolol and clopidogrel. If pain continues, will schedule for heart cath.

## 2023-03-13 NOTE — Assessment & Plan Note (Signed)
01/2023 LDL 41. Continue Crestor 40 mg.

## 2023-03-13 NOTE — Assessment & Plan Note (Signed)
Well controlled. Continue same medications. 

## 2023-03-13 NOTE — Patient Instructions (Signed)
Add metoprolol  Add isosorbide Add

## 2023-03-13 NOTE — Progress Notes (Signed)
Cardiology Office Note   Date:  03/13/2023   ID:  Jesse May, DOB Jun 23, 1956, MRN [id number]  PCP:  Orson Eva, NP  Cardiologist:  Adrian Blackwater, MD      History of Present Illness: Jesse May is a 67 y.o. male who presents for  Chief Complaint  Patient presents with   Follow-up    CCTA & ECHO Results    Patient in office to discuss results of echo and CCTA. Complains of chest pain over left breast.      Past Medical History:  Diagnosis Date   MI (myocardial infarction) (HCC)      Past Surgical History:  Procedure Laterality Date   APPENDECTOMY     CHOLECYSTECTOMY     TONSILLECTOMY       Current Outpatient Medications  Medication Sig Dispense Refill   clopidogrel (PLAVIX) 75 MG tablet Take 1 tablet (75 mg total) by mouth daily. 30 tablet 11   isosorbide mononitrate (IMDUR) 30 MG 24 hr tablet Take 1 tablet (30 mg total) by mouth daily. 30 tablet 1   metoprolol succinate (TOPROL XL) 25 MG 24 hr tablet Take 1 tablet (25 mg total) by mouth daily. 30 tablet 1   amoxicillin-clavulanate (AUGMENTIN) 875-125 MG tablet Take 1 tablet by mouth 2 (two) times daily. 20 tablet 0   Aspirin 81 MG CAPS Aspirin 81 MG Oral Capsule QTY: 0 capsule Days: 0 Refills: 0  Written: 02/07/21 Patient Instructions:     levothyroxine (SYNTHROID) 137 MCG tablet Take 137 mcg by mouth daily before breakfast.     losartan (COZAAR) 50 MG tablet Losartan Potassium 50 MG Oral Tablet QTY: 90 tablet Days: 90 Refills: 3  Written: 05/29/22 Patient Instructions: Take 1 tablet by mouth daily in AM for BP     pantoprazole (PROTONIX) 40 MG tablet Take 1 tablet (40 mg total) by mouth daily. 90 tablet 1   rosuvastatin (CRESTOR) 40 MG tablet Take 40 mg by mouth daily.     No current facility-administered medications for this visit.    Allergies:   Gabapentin    Social History:   reports that he has never smoked. He has never used smokeless tobacco. He reports that he does not currently use alcohol. He  reports that he does not currently use drugs.   Family History:  family history is not on file.    ROS:     Review of Systems  Constitutional: Negative.   HENT: Negative.    Eyes: Negative.   Respiratory: Negative.    Cardiovascular: Negative.   Gastrointestinal: Negative.   Genitourinary: Negative.   Musculoskeletal: Negative.   Skin: Negative.   Neurological: Negative.   Endo/Heme/Allergies: Negative.   Psychiatric/Behavioral: Negative.    All other systems reviewed and are negative.    All other systems are reviewed and negative.    PHYSICAL EXAM: VS:  BP 138/80   Pulse 72   Ht 6\' 2"  (1.88 m)   Wt 202 lb (91.6 kg)   SpO2 98%   BMI 25.94 kg/m  , BMI Body mass index is 25.94 kg/m. Last weight:  Wt Readings from Last 3 Encounters:  03/13/23 202 lb (91.6 kg)  02/13/23 208 lb 12.8 oz (94.7 kg)  02/13/23 210 lb (95.3 kg)    Physical Exam Vitals reviewed.  Constitutional:      Appearance: Normal appearance. He is normal weight.  HENT:     Head: Normocephalic.     Nose: Nose normal.     Mouth/Throat:  Mouth: Mucous membranes are moist.  Eyes:     Pupils: Pupils are equal, round, and reactive to light.  Cardiovascular:     Rate and Rhythm: Normal rate and regular rhythm.     Pulses: Normal pulses.     Heart sounds: Normal heart sounds.  Pulmonary:     Effort: Pulmonary effort is normal.  Abdominal:     General: Abdomen is flat. Bowel sounds are normal.  Musculoskeletal:        General: Normal range of motion.     Cervical back: Normal range of motion.  Skin:    General: Skin is warm.  Neurological:     General: No focal deficit present.     Mental Status: He is alert.  Psychiatric:        Mood and Affect: Mood normal.     EKG: none today  Recent Labs: 01/23/2023: ALT 13; BUN 14; Creatinine, Ser 1.07; Potassium 4.0; Sodium 137; TSH 5.020    Lipid Panel    Component Value Date/Time   CHOL 92 (L) 01/23/2023 1115   TRIG 64 01/23/2023 1115    HDL 37 (L) 01/23/2023 1115   CHOLHDL 2.5 01/23/2023 1115   LDLCALC 41 01/23/2023 1115     Other studies Reviewed: CCTA, echo 02/2023   ASSESSMENT AND PLAN:    ICD-10-CM   1. Atherosclerosis of native coronary artery of native heart with unstable angina pectoris (HCC)  I25.110 clopidogrel (PLAVIX) 75 MG tablet    2. Other chest pain  R07.89 isosorbide mononitrate (IMDUR) 30 MG 24 hr tablet    3. Benign essential HTN  I10     4. Hyperlipidemia, mixed  E78.2        Problem List Items Addressed This Visit       Cardiovascular and Mediastinum   Benign essential HTN    Well controlled. Continue same medications.       Relevant Medications   isosorbide mononitrate (IMDUR) 30 MG 24 hr tablet   metoprolol succinate (TOPROL XL) 25 MG 24 hr tablet   Coronary atherosclerosis of native coronary artery - Primary    Patient continues to have chest pain over left breast. CCTA 02/2023 RCA has no significant disease. Mid LAD is severely calcified with 70% disease. LCX is calcified with mild diffuse disease. Consider Cath. Add isosorbide, metoprolol and clopidogrel. If pain continues, will schedule for heart cath.       Relevant Medications   isosorbide mononitrate (IMDUR) 30 MG 24 hr tablet   clopidogrel (PLAVIX) 75 MG tablet   metoprolol succinate (TOPROL XL) 25 MG 24 hr tablet     Other   Chest pain    Chest pain less often but still present.       Relevant Medications   isosorbide mononitrate (IMDUR) 30 MG 24 hr tablet   Hyperlipidemia, mixed    01/2023 LDL 41. Continue Crestor 40 mg.       Relevant Medications   isosorbide mononitrate (IMDUR) 30 MG 24 hr tablet   metoprolol succinate (TOPROL XL) 25 MG 24 hr tablet     Disposition:   Return in about 1 week (around 03/20/2023).    Total time spent: 30 minutes  Signed,  Adrian Blackwater, MD  03/13/2023 11:29 AM    [facility]

## 2023-03-20 ENCOUNTER — Ambulatory Visit (INDEPENDENT_AMBULATORY_CARE_PROVIDER_SITE_OTHER): Payer: Managed Care, Other (non HMO) | Admitting: Cardiovascular Disease

## 2023-03-20 ENCOUNTER — Ambulatory Visit: Payer: Self-pay | Admitting: Cardiovascular Disease

## 2023-03-20 ENCOUNTER — Other Ambulatory Visit: Payer: Self-pay | Admitting: Cardiovascular Disease

## 2023-03-20 ENCOUNTER — Encounter: Payer: Self-pay | Admitting: Cardiovascular Disease

## 2023-03-20 VITALS — BP 128/68 | HR 52 | Ht 74.0 in | Wt 207.0 lb

## 2023-03-20 DIAGNOSIS — I340 Nonrheumatic mitral (valve) insufficiency: Secondary | ICD-10-CM

## 2023-03-20 DIAGNOSIS — I25110 Atherosclerotic heart disease of native coronary artery with unstable angina pectoris: Secondary | ICD-10-CM | POA: Diagnosis not present

## 2023-03-20 DIAGNOSIS — R0789 Other chest pain: Secondary | ICD-10-CM

## 2023-03-20 DIAGNOSIS — E782 Mixed hyperlipidemia: Secondary | ICD-10-CM | POA: Diagnosis not present

## 2023-03-20 DIAGNOSIS — I200 Unstable angina: Secondary | ICD-10-CM

## 2023-03-20 DIAGNOSIS — I10 Essential (primary) hypertension: Secondary | ICD-10-CM | POA: Diagnosis not present

## 2023-03-20 NOTE — Addendum Note (Signed)
Addended by: Adrian Blackwater A on: 03/20/2023 12:20 PM   Modules accepted: Orders

## 2023-03-20 NOTE — H&P (View-Only) (Signed)
  Cardiology Office Note   Date:  03/20/2023   ID:  Jesse May, DOB 12/05/1955, MRN [id number]  PCP:  Boswell, Chelsa, NP  Cardiologist:  Birgit Nowling, MD      History of Present Illness: Jesse May is a 66 y.o. male who presents for  Chief Complaint  Patient presents with   Follow-up    1 week follow up    Chest Pain  This is a recurrent problem. The current episode started more than 1 month ago. The problem occurs 2 to 4 times per day. The pain is at a severity of 4/10. The quality of the pain is described as squeezing. The pain radiates to the left shoulder and left neck. Associated symptoms include malaise/fatigue and shortness of breath. The pain is aggravated by walking and exertion.      Past Medical History:  Diagnosis Date   MI (myocardial infarction) (HCC)      Past Surgical History:  Procedure Laterality Date   APPENDECTOMY     CHOLECYSTECTOMY     TONSILLECTOMY       Current Outpatient Medications  Medication Sig Dispense Refill   Aspirin 81 MG CAPS Aspirin 81 MG Oral Capsule QTY: 0 capsule Days: 0 Refills: 0  Written: 02/07/21 Patient Instructions:     clopidogrel (PLAVIX) 75 MG tablet Take 1 tablet (75 mg total) by mouth daily. 30 tablet 11   isosorbide mononitrate (IMDUR) 30 MG 24 hr tablet Take 1 tablet (30 mg total) by mouth daily. 30 tablet 1   levothyroxine (SYNTHROID) 137 MCG tablet Take 137 mcg by mouth daily before breakfast.     losartan (COZAAR) 50 MG tablet Losartan Potassium 50 MG Oral Tablet QTY: 90 tablet Days: 90 Refills: 3  Written: 05/29/22 Patient Instructions: Take 1 tablet by mouth daily in AM for BP     metoprolol succinate (TOPROL XL) 25 MG 24 hr tablet Take 1 tablet (25 mg total) by mouth daily. 30 tablet 1   pantoprazole (PROTONIX) 40 MG tablet Take 1 tablet (40 mg total) by mouth daily. 90 tablet 1   rosuvastatin (CRESTOR) 40 MG tablet Take 40 mg by mouth daily.     amoxicillin-clavulanate (AUGMENTIN) 875-125 MG tablet Take 1  tablet by mouth 2 (two) times daily. (Patient not taking: Reported on 03/20/2023) 20 tablet 0   No current facility-administered medications for this visit.    Allergies:   Gabapentin    Social History:   reports that he has never smoked. He has never used smokeless tobacco. He reports that he does not currently use alcohol. He reports that he does not currently use drugs.   Family History:  family history is not on file.    ROS:     Review of Systems  Constitutional:  Positive for malaise/fatigue.  HENT: Negative.    Eyes: Negative.   Respiratory:  Positive for shortness of breath.   Cardiovascular:  Positive for chest pain.  Gastrointestinal: Negative.   Genitourinary: Negative.   Musculoskeletal: Negative.   Skin: Negative.   Neurological: Negative.   Endo/Heme/Allergies: Negative.   Psychiatric/Behavioral: Negative.    All other systems reviewed and are negative.     All other systems are reviewed and negative.    PHYSICAL EXAM: VS:  BP 128/68   Pulse (!) 52   Ht 6' 2" (1.88 m)   Wt 207 lb (93.9 kg)   SpO2 96%   BMI 26.58 kg/m  , BMI Body mass index is 26.58 kg/m. Last   weight:  Wt Readings from Last 3 Encounters:  03/20/23 207 lb (93.9 kg)  03/13/23 202 lb (91.6 kg)  02/13/23 208 lb 12.8 oz (94.7 kg)     Physical Exam Vitals reviewed.  Constitutional:      Appearance: Normal appearance. He is normal weight.  HENT:     Head: Normocephalic.     Nose: Nose normal.     Mouth/Throat:     Mouth: Mucous membranes are moist.  Eyes:     Pupils: Pupils are equal, round, and reactive to light.  Cardiovascular:     Rate and Rhythm: Normal rate and regular rhythm.     Pulses: Normal pulses.     Heart sounds: Normal heart sounds.  Pulmonary:     Effort: Pulmonary effort is normal.  Abdominal:     General: Abdomen is flat. Bowel sounds are normal.  Musculoskeletal:        General: Normal range of motion.     Cervical back: Normal range of motion.  Skin:     General: Skin is warm.  Neurological:     General: No focal deficit present.     Mental Status: He is alert.  Psychiatric:        Mood and Affect: Mood normal.       EKG:   Recent Labs: 01/23/2023: ALT 13; BUN 14; Creatinine, Ser 1.07; Potassium 4.0; Sodium 137; TSH 5.020    Lipid Panel    Component Value Date/Time   CHOL 92 (L) 01/23/2023 1115   TRIG 64 01/23/2023 1115   HDL 37 (L) 01/23/2023 1115   CHOLHDL 2.5 01/23/2023 1115   LDLCALC 41 01/23/2023 1115      Other studies Reviewed: Additional studies/ records that were reviewed today include:  Review of the above records demonstrates:       No data to display            ASSESSMENT AND PLAN:    ICD-10-CM   1. Benign essential HTN  I10     2. Atherosclerosis of native coronary artery of native heart with unstable angina pectoris (HCC)  I25.110     3. Hyperlipidemia, mixed  E78.2     4. Nonrheumatic mitral valve regurgitation  I34.0    mild       Problem List Items Addressed This Visit       Cardiovascular and Mediastinum   Benign essential HTN - Primary   Coronary atherosclerosis of native coronary artery    CTA coronaries showed ca score 1000, with mid LAD 70%, and still has chest pain inspite of maximizing medical therapy. Advise cardiac cath. Patient agreed.        Other   Hyperlipidemia, mixed   Other Visit Diagnoses     Nonrheumatic mitral valve regurgitation       mild          Disposition:   Return in about 2 weeks (around 04/03/2023) for set up cardia cath.    Total time spent: 45 minutes  Signed,  Zeven Kocak, MD  03/20/2023 12:14 PM    [facility] 

## 2023-03-20 NOTE — Progress Notes (Signed)
Cardiology Office Note   Date:  03/20/2023   ID:  Jesse May, DOB 1956-06-13, MRN [id number]  PCP:  Orson Eva, NP  Cardiologist:  Adrian Blackwater, MD      History of Present Illness: Jesse May is a 67 y.o. male who presents for  Chief Complaint  Patient presents with   Follow-up    1 week follow up    Chest Pain  This is a recurrent problem. The current episode started more than 1 month ago. The problem occurs 2 to 4 times per day. The pain is at a severity of 4/10. The quality of the pain is described as squeezing. The pain radiates to the left shoulder and left neck. Associated symptoms include malaise/fatigue and shortness of breath. The pain is aggravated by walking and exertion.      Past Medical History:  Diagnosis Date   MI (myocardial infarction) (HCC)      Past Surgical History:  Procedure Laterality Date   APPENDECTOMY     CHOLECYSTECTOMY     TONSILLECTOMY       Current Outpatient Medications  Medication Sig Dispense Refill   Aspirin 81 MG CAPS Aspirin 81 MG Oral Capsule QTY: 0 capsule Days: 0 Refills: 0  Written: 02/07/21 Patient Instructions:     clopidogrel (PLAVIX) 75 MG tablet Take 1 tablet (75 mg total) by mouth daily. 30 tablet 11   isosorbide mononitrate (IMDUR) 30 MG 24 hr tablet Take 1 tablet (30 mg total) by mouth daily. 30 tablet 1   levothyroxine (SYNTHROID) 137 MCG tablet Take 137 mcg by mouth daily before breakfast.     losartan (COZAAR) 50 MG tablet Losartan Potassium 50 MG Oral Tablet QTY: 90 tablet Days: 90 Refills: 3  Written: 05/29/22 Patient Instructions: Take 1 tablet by mouth daily in AM for BP     metoprolol succinate (TOPROL XL) 25 MG 24 hr tablet Take 1 tablet (25 mg total) by mouth daily. 30 tablet 1   pantoprazole (PROTONIX) 40 MG tablet Take 1 tablet (40 mg total) by mouth daily. 90 tablet 1   rosuvastatin (CRESTOR) 40 MG tablet Take 40 mg by mouth daily.     amoxicillin-clavulanate (AUGMENTIN) 875-125 MG tablet Take 1  tablet by mouth 2 (two) times daily. (Patient not taking: Reported on 03/20/2023) 20 tablet 0   No current facility-administered medications for this visit.    Allergies:   Gabapentin    Social History:   reports that he has never smoked. He has never used smokeless tobacco. He reports that he does not currently use alcohol. He reports that he does not currently use drugs.   Family History:  family history is not on file.    ROS:     Review of Systems  Constitutional:  Positive for malaise/fatigue.  HENT: Negative.    Eyes: Negative.   Respiratory:  Positive for shortness of breath.   Cardiovascular:  Positive for chest pain.  Gastrointestinal: Negative.   Genitourinary: Negative.   Musculoskeletal: Negative.   Skin: Negative.   Neurological: Negative.   Endo/Heme/Allergies: Negative.   Psychiatric/Behavioral: Negative.    All other systems reviewed and are negative.     All other systems are reviewed and negative.    PHYSICAL EXAM: VS:  BP 128/68   Pulse (!) 52   Ht 6\' 2"  (1.88 m)   Wt 207 lb (93.9 kg)   SpO2 96%   BMI 26.58 kg/m  , BMI Body mass index is 26.58 kg/m. Last  weight:  Wt Readings from Last 3 Encounters:  03/20/23 207 lb (93.9 kg)  03/13/23 202 lb (91.6 kg)  02/13/23 208 lb 12.8 oz (94.7 kg)     Physical Exam Vitals reviewed.  Constitutional:      Appearance: Normal appearance. He is normal weight.  HENT:     Head: Normocephalic.     Nose: Nose normal.     Mouth/Throat:     Mouth: Mucous membranes are moist.  Eyes:     Pupils: Pupils are equal, round, and reactive to light.  Cardiovascular:     Rate and Rhythm: Normal rate and regular rhythm.     Pulses: Normal pulses.     Heart sounds: Normal heart sounds.  Pulmonary:     Effort: Pulmonary effort is normal.  Abdominal:     General: Abdomen is flat. Bowel sounds are normal.  Musculoskeletal:        General: Normal range of motion.     Cervical back: Normal range of motion.  Skin:     General: Skin is warm.  Neurological:     General: No focal deficit present.     Mental Status: He is alert.  Psychiatric:        Mood and Affect: Mood normal.       EKG:   Recent Labs: 01/23/2023: ALT 13; BUN 14; Creatinine, Ser 1.07; Potassium 4.0; Sodium 137; TSH 5.020    Lipid Panel    Component Value Date/Time   CHOL 92 (L) 01/23/2023 1115   TRIG 64 01/23/2023 1115   HDL 37 (L) 01/23/2023 1115   CHOLHDL 2.5 01/23/2023 1115   LDLCALC 41 01/23/2023 1115      Other studies Reviewed: Additional studies/ records that were reviewed today include:  Review of the above records demonstrates:       No data to display            ASSESSMENT AND PLAN:    ICD-10-CM   1. Benign essential HTN  I10     2. Atherosclerosis of native coronary artery of native heart with unstable angina pectoris (HCC)  I25.110     3. Hyperlipidemia, mixed  E78.2     4. Nonrheumatic mitral valve regurgitation  I34.0    mild       Problem List Items Addressed This Visit       Cardiovascular and Mediastinum   Benign essential HTN - Primary   Coronary atherosclerosis of native coronary artery    CTA coronaries showed ca score 1000, with mid LAD 70%, and still has chest pain inspite of maximizing medical therapy. Advise cardiac cath. Patient agreed.        Other   Hyperlipidemia, mixed   Other Visit Diagnoses     Nonrheumatic mitral valve regurgitation       mild          Disposition:   Return in about 2 weeks (around 04/03/2023) for set up cardia cath.    Total time spent: 45 minutes  Signed,  Adrian Blackwater, MD  03/20/2023 12:14 PM    [facility]

## 2023-03-20 NOTE — Assessment & Plan Note (Signed)
CTA coronaries showed ca score 1000, with mid LAD 70%, and still has chest pain inspite of maximizing medical therapy. Advise cardiac cath. Patient agreed.

## 2023-03-21 LAB — CBC WITH DIFF/PLATELET
Basophils Absolute: 0 10*3/uL (ref 0.0–0.2)
Basos: 1 %
EOS (ABSOLUTE): 0.2 10*3/uL (ref 0.0–0.4)
Eos: 3 %
Hematocrit: 43.7 % (ref 37.5–51.0)
Hemoglobin: 14.7 g/dL (ref 13.0–17.7)
Immature Grans (Abs): 0 10*3/uL (ref 0.0–0.1)
Immature Granulocytes: 0 %
Lymphocytes Absolute: 1 10*3/uL (ref 0.7–3.1)
Lymphs: 17 %
MCH: 29.6 pg (ref 26.6–33.0)
MCHC: 33.6 g/dL (ref 31.5–35.7)
MCV: 88 fL (ref 79–97)
Monocytes Absolute: 0.4 10*3/uL (ref 0.1–0.9)
Monocytes: 7 %
Neutrophils Absolute: 4.1 10*3/uL (ref 1.4–7.0)
Neutrophils: 72 %
Platelets: 168 10*3/uL (ref 150–450)
RBC: 4.96 x10E6/uL (ref 4.14–5.80)
RDW: 12.8 % (ref 11.6–15.4)
WBC: 5.7 10*3/uL (ref 3.4–10.8)

## 2023-03-21 LAB — COMPREHENSIVE METABOLIC PANEL
ALT: 25 IU/L (ref 0–44)
AST: 16 IU/L (ref 0–40)
Albumin/Globulin Ratio: 2.4 — ABNORMAL HIGH (ref 1.2–2.2)
Albumin: 4.4 g/dL (ref 3.9–4.9)
Alkaline Phosphatase: 82 IU/L (ref 44–121)
BUN/Creatinine Ratio: 15 (ref 10–24)
BUN: 15 mg/dL (ref 8–27)
Bilirubin Total: 1.3 mg/dL — ABNORMAL HIGH (ref 0.0–1.2)
CO2: 25 mmol/L (ref 20–29)
Calcium: 9.2 mg/dL (ref 8.6–10.2)
Chloride: 99 mmol/L (ref 96–106)
Creatinine, Ser: 0.99 mg/dL (ref 0.76–1.27)
Globulin, Total: 1.8 g/dL (ref 1.5–4.5)
Glucose: 80 mg/dL (ref 70–99)
Potassium: 4.2 mmol/L (ref 3.5–5.2)
Sodium: 138 mmol/L (ref 134–144)
Total Protein: 6.2 g/dL (ref 6.0–8.5)
eGFR: 83 mL/min/{1.73_m2} (ref >59)

## 2023-03-26 ENCOUNTER — Other Ambulatory Visit: Payer: Self-pay | Admitting: Cardiology

## 2023-03-26 ENCOUNTER — Observation Stay
Admission: RE | Admit: 2023-03-26 | Discharge: 2023-03-27 | Disposition: A | Discharge status: Home / Self Care | Payer: Managed Care, Other (non HMO) | Attending: Internal Medicine | Admitting: Internal Medicine

## 2023-03-26 ENCOUNTER — Other Ambulatory Visit: Payer: Self-pay

## 2023-03-26 ENCOUNTER — Ambulatory Visit: Admit: 2023-03-26 | Payer: Medicare Other | Admitting: Cardiology

## 2023-03-26 ENCOUNTER — Encounter
Admission: RE | Disposition: A | Discharge status: Home / Self Care | Payer: Self-pay | Source: Home / Self Care | Attending: Family Medicine

## 2023-03-26 ENCOUNTER — Encounter: Payer: Self-pay | Admitting: Cardiovascular Disease

## 2023-03-26 DIAGNOSIS — I209 Angina pectoris, unspecified: Secondary | ICD-10-CM | POA: Diagnosis present

## 2023-03-26 DIAGNOSIS — R079 Chest pain, unspecified: Secondary | ICD-10-CM | POA: Insufficient documentation

## 2023-03-26 DIAGNOSIS — R0789 Other chest pain: Secondary | ICD-10-CM | POA: Diagnosis present

## 2023-03-26 DIAGNOSIS — E785 Hyperlipidemia, unspecified: Secondary | ICD-10-CM

## 2023-03-26 DIAGNOSIS — I2510 Atherosclerotic heart disease of native coronary artery without angina pectoris: Secondary | ICD-10-CM | POA: Insufficient documentation

## 2023-03-26 DIAGNOSIS — Z7982 Long term (current) use of aspirin: Secondary | ICD-10-CM | POA: Diagnosis not present

## 2023-03-26 DIAGNOSIS — Z79899 Other long term (current) drug therapy: Secondary | ICD-10-CM | POA: Insufficient documentation

## 2023-03-26 DIAGNOSIS — Z955 Presence of coronary angioplasty implant and graft: Secondary | ICD-10-CM

## 2023-03-26 DIAGNOSIS — I10 Essential (primary) hypertension: Secondary | ICD-10-CM | POA: Diagnosis not present

## 2023-03-26 DIAGNOSIS — E039 Hypothyroidism, unspecified: Secondary | ICD-10-CM | POA: Diagnosis present

## 2023-03-26 DIAGNOSIS — K219 Gastro-esophageal reflux disease without esophagitis: Secondary | ICD-10-CM | POA: Diagnosis present

## 2023-03-26 DIAGNOSIS — E782 Mixed hyperlipidemia: Secondary | ICD-10-CM | POA: Diagnosis present

## 2023-03-26 DIAGNOSIS — Z7902 Long term (current) use of antithrombotics/antiplatelets: Secondary | ICD-10-CM | POA: Insufficient documentation

## 2023-03-26 DIAGNOSIS — I200 Unstable angina: Secondary | ICD-10-CM | POA: Diagnosis present

## 2023-03-26 DIAGNOSIS — I25119 Atherosclerotic heart disease of native coronary artery with unspecified angina pectoris: Secondary | ICD-10-CM | POA: Diagnosis present

## 2023-03-26 DIAGNOSIS — I25118 Atherosclerotic heart disease of native coronary artery with other forms of angina pectoris: Secondary | ICD-10-CM

## 2023-03-26 DIAGNOSIS — I25110 Atherosclerotic heart disease of native coronary artery with unstable angina pectoris: Secondary | ICD-10-CM | POA: Diagnosis not present

## 2023-03-26 DIAGNOSIS — T82855A Stenosis of coronary artery stent, initial encounter: Secondary | ICD-10-CM | POA: Diagnosis present

## 2023-03-26 HISTORY — PX: CORONARY BALLOON ANGIOPLASTY: CATH118233

## 2023-03-26 HISTORY — PX: LEFT HEART CATH AND CORONARY ANGIOGRAPHY: CATH118249

## 2023-03-26 LAB — CBC
HCT: 50 % (ref 39.0–52.0)
Hemoglobin: 16.9 g/dL (ref 13.0–17.0)
MCH: 28.9 pg (ref 26.0–34.0)
MCHC: 33.8 g/dL (ref 30.0–36.0)
MCV: 85.5 fL (ref 80.0–100.0)
Platelets: 183 10*3/uL (ref 150–400)
RBC: 5.85 MIL/uL — ABNORMAL HIGH (ref 4.22–5.81)
RDW: 13.2 % (ref 11.5–15.5)
WBC: 7.7 10*3/uL (ref 4.0–10.5)
nRBC: 0 % (ref 0.0–0.2)

## 2023-03-26 LAB — BASIC METABOLIC PANEL
Anion gap: 8 (ref 5–15)
BUN: 16 mg/dL (ref 8–23)
CO2: 27 mmol/L (ref 22–32)
Calcium: 8.8 mg/dL — ABNORMAL LOW (ref 8.9–10.3)
Chloride: 103 mmol/L (ref 98–111)
Creatinine, Ser: 0.88 mg/dL (ref 0.61–1.24)
GFR, Estimated: >60 mL/min (ref >60)
Glucose, Bld: 90 mg/dL (ref 70–99)
Potassium: 3.5 mmol/L (ref 3.5–5.1)
Sodium: 138 mmol/L (ref 135–145)

## 2023-03-26 LAB — POCT ACTIVATED CLOTTING TIME
Activated Clotting Time: 324 seconds
Activated Clotting Time: 513 seconds

## 2023-03-26 SURGERY — CORONARY BALLOON ANGIOPLASTY
Anesthesia: Moderate Sedation

## 2023-03-26 SURGERY — LEFT HEART CATH AND CORONARY ANGIOGRAPHY
Anesthesia: Moderate Sedation | Laterality: Left

## 2023-03-26 MED ORDER — HEPARIN (PORCINE) IN NACL 1000-0.9 UT/500ML-% IV SOLN
INTRAVENOUS | Status: AC
  Filled 2023-03-26: qty 1000

## 2023-03-26 MED ORDER — SODIUM CHLORIDE 0.9 % IV SOLN: 250.0000 mL | INTRAVENOUS | Status: DC | PRN

## 2023-03-26 MED ORDER — MIDAZOLAM HCL 2 MG/2ML IJ SOLN
INTRAMUSCULAR | Status: AC
  Filled 2023-03-26: qty 2

## 2023-03-26 MED ORDER — IOHEXOL 350 MG/ML SOLN
INTRAVENOUS | Status: DC | PRN
  Administered 2023-03-26: 60 mL

## 2023-03-26 MED ORDER — VERAPAMIL HCL 2.5 MG/ML IV SOLN
INTRAVENOUS | Status: AC
  Filled 2023-03-26: qty 2

## 2023-03-26 MED ORDER — HEPARIN (PORCINE) IN NACL 2000-0.9 UNIT/L-% IV SOLN
INTRAVENOUS | Status: DC | PRN
  Administered 2023-03-26: 1000 mL

## 2023-03-26 MED ORDER — SODIUM CHLORIDE 0.9 % WEIGHT BASED INFUSION
1.0000 mL/kg/h | INTRAVENOUS | Status: DC
  Administered 2023-03-26: 1 mL/kg/h via INTRAVENOUS

## 2023-03-26 MED ORDER — ACETAMINOPHEN 325 MG PO TABS
650.0000 mg | ORAL_TABLET | ORAL | Status: DC | PRN
  Administered 2023-03-26 (×2): 650 mg via ORAL
  Filled 2023-03-26: qty 2

## 2023-03-26 MED ORDER — CLOPIDOGREL BISULFATE 75 MG PO TABS
ORAL_TABLET | ORAL | Status: AC
  Filled 2023-03-26: qty 4

## 2023-03-26 MED ORDER — ROSUVASTATIN CALCIUM 20 MG PO TABS
40.0000 mg | ORAL_TABLET | Freq: Every day | ORAL | Status: DC
  Administered 2023-03-26: 40 mg via ORAL
  Filled 2023-03-26: qty 4
  Filled 2023-03-26: qty 2

## 2023-03-26 MED ORDER — ONDANSETRON HCL 4 MG/2ML IJ SOLN: 4.0000 mg | Freq: Four times a day (QID) | INTRAMUSCULAR | Status: DC | PRN

## 2023-03-26 MED ORDER — ASPIRIN 81 MG PO CHEW
CHEWABLE_TABLET | ORAL | Status: AC
  Filled 2023-03-26: qty 1

## 2023-03-26 MED ORDER — SODIUM CHLORIDE 0.9% FLUSH: 3.0000 mL | INTRAVENOUS | Status: DC | PRN

## 2023-03-26 MED ORDER — ENOXAPARIN SODIUM 40 MG/0.4ML IJ SOSY
40.0000 mg | PREFILLED_SYRINGE | INTRAMUSCULAR | Status: DC
  Filled 2023-03-26: qty 0.4

## 2023-03-26 MED ORDER — HYDRALAZINE HCL 20 MG/ML IJ SOLN
10.0000 mg | INTRAMUSCULAR | Status: AC | PRN
  Administered 2023-03-26 (×2): 10 mg via INTRAVENOUS

## 2023-03-26 MED ORDER — HEPARIN SODIUM (PORCINE) 1000 UNIT/ML IJ SOLN
INTRAMUSCULAR | Status: DC | PRN
  Administered 2023-03-26: 9000 [IU] via INTRAVENOUS

## 2023-03-26 MED ORDER — ISOSORBIDE MONONITRATE ER 30 MG PO TB24
30.0000 mg | ORAL_TABLET | Freq: Every day | ORAL | Status: DC
  Administered 2023-03-26 – 2023-03-27 (×2): 30 mg via ORAL
  Filled 2023-03-26 (×2): qty 1

## 2023-03-26 MED ORDER — IOHEXOL 300 MG/ML  SOLN
INTRAMUSCULAR | Status: DC | PRN
  Administered 2023-03-26: 60 mL

## 2023-03-26 MED ORDER — LABETALOL HCL 5 MG/ML IV SOLN: 10.0000 mg | INTRAVENOUS | Status: AC | PRN

## 2023-03-26 MED ORDER — ASPIRIN 81 MG PO CHEW: 81.0000 mg | CHEWABLE_TABLET | ORAL | Status: DC

## 2023-03-26 MED ORDER — SODIUM CHLORIDE 0.9 % IV SOLN: INTRAVENOUS | Status: AC

## 2023-03-26 MED ORDER — CLOPIDOGREL BISULFATE 75 MG PO TABS
75.0000 mg | ORAL_TABLET | Freq: Every day | ORAL | Status: DC
  Administered 2023-03-27: 75 mg via ORAL
  Filled 2023-03-26: qty 1

## 2023-03-26 MED ORDER — ACETAMINOPHEN 325 MG PO TABS: 650.0000 mg | ORAL_TABLET | ORAL | Status: DC | PRN

## 2023-03-26 MED ORDER — SODIUM CHLORIDE 0.9% FLUSH
3.0000 mL | Freq: Two times a day (BID) | INTRAVENOUS | Status: DC
  Administered 2023-03-26: 3 mL via INTRAVENOUS

## 2023-03-26 MED ORDER — CLOPIDOGREL BISULFATE 75 MG PO TABS
ORAL_TABLET | ORAL | Status: DC | PRN
  Administered 2023-03-26: 300 mg via ORAL

## 2023-03-26 MED ORDER — PANTOPRAZOLE SODIUM 40 MG PO TBEC
40.0000 mg | DELAYED_RELEASE_TABLET | Freq: Every day | ORAL | Status: DC
  Administered 2023-03-26 – 2023-03-27 (×2): 40 mg via ORAL
  Filled 2023-03-26 (×2): qty 1

## 2023-03-26 MED ORDER — SODIUM CHLORIDE 0.9% FLUSH: 3.0000 mL | Freq: Two times a day (BID) | INTRAVENOUS | Status: DC

## 2023-03-26 MED ORDER — SODIUM CHLORIDE 0.9 % WEIGHT BASED INFUSION: 3.0000 mL/kg/h | INTRAVENOUS | Status: DC

## 2023-03-26 MED ORDER — LEVOTHYROXINE SODIUM 137 MCG PO TABS
137.0000 ug | ORAL_TABLET | Freq: Every day | ORAL | Status: DC
  Administered 2023-03-27: 137 ug via ORAL
  Filled 2023-03-26: qty 1

## 2023-03-26 MED ORDER — ACETAMINOPHEN 325 MG PO TABS
ORAL_TABLET | ORAL | Status: AC
  Filled 2023-03-26: qty 2

## 2023-03-26 MED ORDER — SODIUM CHLORIDE 0.9 % WEIGHT BASED INFUSION: 1.0000 mL/kg/h | INTRAVENOUS | Status: DC

## 2023-03-26 MED ORDER — FENTANYL CITRATE (PF) 100 MCG/2ML IJ SOLN
INTRAMUSCULAR | Status: AC
  Filled 2023-03-26: qty 2

## 2023-03-26 MED ORDER — FENTANYL CITRATE (PF) 100 MCG/2ML IJ SOLN
INTRAMUSCULAR | Status: DC | PRN
  Administered 2023-03-26: 50 ug via INTRAVENOUS
  Administered 2023-03-26: 25 ug

## 2023-03-26 MED ORDER — VERAPAMIL HCL 2.5 MG/ML IV SOLN
INTRAVENOUS | Status: DC | PRN
  Administered 2023-03-26: 2.5 mg via INTRAVENOUS

## 2023-03-26 MED ORDER — LIDOCAINE HCL 1 % IJ SOLN
INTRAMUSCULAR | Status: AC
  Filled 2023-03-26: qty 20

## 2023-03-26 MED ORDER — ASPIRIN 81 MG PO CHEW
81.0000 mg | CHEWABLE_TABLET | Freq: Every day | ORAL | Status: DC
  Administered 2023-03-27: 81 mg via ORAL
  Filled 2023-03-26: qty 1

## 2023-03-26 MED ORDER — ASPIRIN 81 MG PO CHEW
81.0000 mg | CHEWABLE_TABLET | ORAL | Status: AC
  Administered 2023-03-26: 81 mg via ORAL

## 2023-03-26 MED ORDER — SODIUM CHLORIDE 0.9% FLUSH
3.0000 mL | Freq: Two times a day (BID) | INTRAVENOUS | Status: DC
Start: 2023-03-26 — End: 2023-04-28

## 2023-03-26 MED ORDER — METOPROLOL SUCCINATE ER 50 MG PO TB24: 25.0000 mg | ORAL_TABLET | Freq: Every day | ORAL | Status: DC

## 2023-03-26 MED ORDER — HEPARIN (PORCINE) IN NACL 1000-0.9 UT/500ML-% IV SOLN
INTRAVENOUS | Status: DC | PRN
  Administered 2023-03-26 (×2): 500 mL

## 2023-03-26 MED ORDER — ENOXAPARIN SODIUM 40 MG/0.4ML IJ SOSY: 40.0000 mg | PREFILLED_SYRINGE | INTRAMUSCULAR | Status: DC

## 2023-03-26 MED ORDER — LABETALOL HCL 5 MG/ML IV SOLN: 10.0000 mg | INTRAVENOUS | Status: DC | PRN

## 2023-03-26 MED ORDER — SODIUM CHLORIDE 0.9 % WEIGHT BASED INFUSION
3.0000 mL/kg/h | INTRAVENOUS | Status: DC
  Administered 2023-03-26: 3 mL/kg/h via INTRAVENOUS

## 2023-03-26 MED ORDER — MIDAZOLAM HCL 2 MG/2ML IJ SOLN
INTRAMUSCULAR | Status: DC | PRN
  Administered 2023-03-26: 1 mg via INTRAVENOUS
  Administered 2023-03-26: 2 mg

## 2023-03-26 MED ORDER — LOSARTAN POTASSIUM 50 MG PO TABS
50.0000 mg | ORAL_TABLET | Freq: Every day | ORAL | Status: DC
  Administered 2023-03-26 – 2023-03-27 (×2): 50 mg via ORAL
  Filled 2023-03-26 (×2): qty 1

## 2023-03-26 MED ORDER — SODIUM CHLORIDE 0.9 % WEIGHT BASED INFUSION: 1.0000 mL/kg/h | INTRAVENOUS | Status: AC

## 2023-03-26 MED ORDER — HEPARIN SODIUM (PORCINE) 1000 UNIT/ML IJ SOLN
INTRAMUSCULAR | Status: AC
  Filled 2023-03-26: qty 10

## 2023-03-26 MED ORDER — HYDRALAZINE HCL 20 MG/ML IJ SOLN
INTRAMUSCULAR | Status: AC
  Filled 2023-03-26: qty 1

## 2023-03-26 SURGICAL SUPPLY — 17 items
BALLN SCOREFLEX 2.75X15 (BALLOONS) ×1
BALLN TREK RX 2.5X12 (BALLOONS) ×1
BALLOON SCOREFLEX 2.75X15 (BALLOONS) IMPLANT
BALLOON TREK RX 2.5X12 (BALLOONS) IMPLANT
CATH VISTA GUIDE 6FR XB3.5 (CATHETERS) IMPLANT
DEVICE RAD TR BAND REGULAR (VASCULAR PRODUCTS) IMPLANT
DRAPE BRACHIAL (DRAPES) IMPLANT
GLIDESHEATH SLEND SS 6F .021 (SHEATH) IMPLANT
GUIDEWIRE INQWIRE 1.5J.035X260 (WIRE) IMPLANT
INQWIRE 1.5J .035X260CM (WIRE) ×2
KIT ENCORE 26 ADVANTAGE (KITS) IMPLANT
PACK CARDIAC CATH (CUSTOM PROCEDURE TRAY) ×1 IMPLANT
PROTECTION STATION PRESSURIZED (MISCELLANEOUS) ×1
SET ATX-X65L (MISCELLANEOUS) IMPLANT
STATION PROTECTION PRESSURIZED (MISCELLANEOUS) IMPLANT
TUBING CIL FLEX 10 FLL-RA (TUBING) IMPLANT
WIRE ASAHI PROWATER 180CM (WIRE) IMPLANT

## 2023-03-26 SURGICAL SUPPLY — 10 items
CATH INFINITI 5FR MULTPACK ANG (CATHETERS) IMPLANT
DEVICE CLOSURE MYNXGRIP 5F (Vascular Products) IMPLANT
NDL PERC 18GX7CM (NEEDLE) IMPLANT
NEEDLE PERC 18GX7CM (NEEDLE) ×1 IMPLANT
PACK CARDIAC CATH (CUSTOM PROCEDURE TRAY) ×1 IMPLANT
PROTECTION STATION PRESSURIZED (MISCELLANEOUS) ×1
SET ATX-X65L (MISCELLANEOUS) IMPLANT
SHEATH AVANTI 5FR X 11CM (SHEATH) IMPLANT
STATION PROTECTION PRESSURIZED (MISCELLANEOUS) IMPLANT
WIRE GUIDERIGHT .035X150 (WIRE) IMPLANT

## 2023-03-26 NOTE — Assessment & Plan Note (Signed)
Cont synthroid 

## 2023-03-26 NOTE — Brief Op Note (Signed)
   BRIEF PERCUTANEOUS CORONARY INTERVENTION NOTE  03/26/2023  2:35 PM  PATIENT:  Jesse May  67 y.o. male patient of Dr. Dr. Welton Flakes who underwent diagnostic catheterization this morning for symptoms concerning for progressive angina and abnormal coronary CTA which suggested an LAD lesion.  However he was found to have 90% ISR in LCx stent placed in 2012.  I was asked to assist with intervention.  Due to scheduling conflict, the patient was taken off the table and returns here to the Cath Lab for staged PTCA/PCI of the LCx via radial access.  The femoral access site was closed with a Mynx closure device earlier today.  It was stable.  He has not any chest pain in the hospital.  PRE-OPERATIVE DIAGNOSIS:    POST-OPERATIVE DIAGNOSIS:   Successful scoring balloon angioplasty/PTCA of 90% ISR of LCx stent reducing 90% to 0% with TIMI-3 flow pre and post.  PROCEDURE:  Procedure(s): CORONARY STENT INTERVENTION (N/A)  SURGEON:  Surgeon(s) and Role:    * Marykay Lex, MD - Primary   PROCEDURE PERFORMED Right Radial Access: 6 French glide sheath using micropuncture needle-modified Seldinger technique. IV heparin 9000 units administered (ACT was 324)  He had not had his Plavix morning.  He was given 300 mg p.o. Plavix LCx PCI: [place] XB 3.5 guide catheter advanced over wire into the ascending aorta and used to engage the left coronary artery; Prowater wire advanced Predilation with 2.5 mm x 12 mm balloon using stent boost to ensure that the lesion was all within the stent -> 12 ATM X 20 SEC The lesion was then dilated with a score flex 2.75 MM  X scoring balloon to high atmospheres 16 ATM x 40 SEC => 90% lesion reduced to 0% with TIMI-3 flow pre and post. Guidewire removed, then guide catheter moved out of body over wire without complication. Radial sheath removed: TR band placed 11 mL air, 1435 hrs.-reverse Barbeau-B  ANESTHESIA:   local and IV sedation -> 4 mL subcu lidocaine for radial  access; 2 mg Versed, 25 migraine fentanyl  EBL:  <50 mL  COUNTS:  YES  DICTATION: .Note written in EPIC  PATIENT DISPOSITION:  PACU - hemodynamically stable.  PLAN OF CARE: The patient will be admitted for outpatient with extended stay post PCI on the hospital service to be discharged at the discretion of the hospitalist and Dr. Welton Flakes     Delay start of Pharmacological VTE agent (>24hrs) due to surgical blood loss or risk of bleeding: not applicable   Bryan Lemma, MD  Marykay Lex, MD, MS Bryan Lemma, M.D., M.S. Interventional Cardiologist  [facility]   [address] [place], [place]  [address] [phone]           Fax [phone] ]

## 2023-03-26 NOTE — Interval H&P Note (Signed)
History and Physical Interval Note:  03/26/2023 12:49 PM  [facility]  has presented today for surgery, with the diagnosis of progressive angina with documented CAD on coronary CTA and cardiac catheterization.  The various methods of treatment have been discussed with the patient and family. After consideration of risks, benefits and other options for treatment, the patient has consented to    PERCUTANEOUS CORONARY INTERVENTION   as a surgical intervention.  The patient's history has been reviewed, patient examined, no change in status, stable for surgery.  I have reviewed the patient's chart and labs.  Questions were answered to the patient's satisfaction.    Cath Lab Visit (complete for each Cath Lab visit)  Clinical Evaluation Leading to the Procedure:   ACS: No.  Non-ACS:    Anginal Classification: CCS III  Anti-ischemic medical therapy: Maximal Therapy (2 or more classes of medications)  Non-Invasive Test Results: High-risk stress test findings: cardiac mortality >3%/year -> high risk Coronary CTA with confirmed severe ISR of LCx stent on diagnostic cath.  Prior CABG: No previous CABG    Bryan Lemma

## 2023-03-26 NOTE — Hospital Course (Signed)
Jesse May is a 67 y.o. male  [id number]  Primary Cardiologist: Adrian Blackwater Reason for Consultation: Unstable angina  HPI: This is a 67 year old white male with a past medical history of PCI and stenting of the left circumflex and the [place] presented to the hospital hospital for cardiac catheterization today.  Patient had a positive stress test with ischemia on nuclear stress test and underwent CTA coronaries which showed 70% mid LAD lesion and was scheduled for cardiac catheterization.  Patient underwent cardiac catheterization which revealed 90% restenosis of the mid left circumflex and 50 to 55% mid LAD and 40 to 50% ostial LAD along with distal RCA which had 40 to 50% disease.  Left ventricular ejection fraction was normal.   Review of Systems: No orthopnea PND leg swelling   Past Medical History:  Diagnosis Date   MI (myocardial infarction) (HCC)     Medications Prior to Admission  Medication Sig Dispense Refill   amoxicillin-clavulanate (AUGMENTIN) 875-125 MG tablet Take 1 tablet by mouth 2 (two) times daily. (Patient not taking: Reported on 03/20/2023) 20 tablet 0   Aspirin 81 MG CAPS Aspirin 81 MG Oral Capsule QTY: 0 capsule Days: 0 Refills: 0  Written: 02/07/21 Patient Instructions:     clopidogrel (PLAVIX) 75 MG tablet Take 1 tablet (75 mg total) by mouth daily. 30 tablet 11   isosorbide mononitrate (IMDUR) 30 MG 24 hr tablet Take 1 tablet (30 mg total) by mouth daily. 30 tablet 1   levothyroxine (SYNTHROID) 137 MCG tablet Take 137 mcg by mouth daily before breakfast.     losartan (COZAAR) 50 MG tablet Losartan Potassium 50 MG Oral Tablet QTY: 90 tablet Days: 90 Refills: 3  Written: 05/29/22 Patient Instructions: Take 1 tablet by mouth daily in AM for BP     metoprolol succinate (TOPROL XL) 25 MG 24 hr tablet Take 1 tablet (25 mg total) by mouth daily. 30 tablet 1   pantoprazole (PROTONIX) 40 MG tablet Take 1 tablet (40 mg total) by mouth daily. 90 tablet 1   rosuvastatin  (CRESTOR) 40 MG tablet Take 40 mg by mouth daily.        aspirin       sodium chloride flush  3 mL Intravenous Q12H    Infusions:  sodium chloride     [START ON 03/27/2023] sodium chloride 3 mL/kg/hr (03/26/23 0904)   Followed by   Melene Muller ON 03/27/2023] sodium chloride 1 mL/kg/hr (03/26/23 0903)    Allergies  Allergen Reactions   Gabapentin     Note: visual disturbances    Social History   Socioeconomic History   Marital status: Divorced    Spouse name: Not on file   Number of children: Not on file   Years of education: Not on file   Highest education level: Not on file  Occupational History   Not on file  Tobacco Use   Smoking status: Never   Smokeless tobacco: Never  Vaping Use   Vaping Use: Never used  Substance and Sexual Activity   Alcohol use: Not Currently   Drug use: Not Currently   Sexual activity: Not on file  Other Topics Concern   Not on file  Social History Narrative   Lives alone.  [name], here today. Has pet, dog.    Social Determinants of Health   Financial Resource Strain: Not on file  Food Insecurity: Not on file  Transportation Needs: Not on file  Physical Activity: Not on file  Stress: Not  on file  Social Connections: Not on file  Intimate Partner Violence: Not on file    History reviewed. No pertinent family history.  PHYSICAL EXAM: Vitals:   03/26/23 0846 03/26/23 0938  BP: (!) 175/82   Pulse: (!) 52   Resp: 15   Temp: 98 F (36.7 C)   SpO2: 97% 100%    No intake or output data in the 24 hours ending 03/26/23 1044  General:  Well appearing. No respiratory difficulty HEENT: normal Neck: supple. no JVD. Carotids 2+ bilat; no bruits. No lymphadenopathy or thryomegaly appreciated. Cor: PMI nondisplaced. Regular rate & rhythm. No rubs, gallops or murmurs. Lungs: clear Abdomen: soft, nontender, nondistended. No hepatosplenomegaly. No bruits or masses. Good bowel sounds. Extremities: no cyanosis, clubbing, rash,  edema Neuro: alert & oriented x 3, cranial nerves grossly intact. moves all 4 extremities w/o difficulty. Affect pleasant.  ECG: Normal sinus rhythm no acute changes  No results found for this or any previous visit (from the past 24 hour(s)). CARDIAC CATHETERIZATION  Result Date: 03/26/2023   Prox Cx to Mid Cx lesion is 90% stenosed.   Mid LAD lesion is 55% stenosed.   Dist RCA lesion is 45% stenosed.   The left ventricular systolic function is normal.   LV end diastolic pressure is normal.   The left ventricular ejection fraction is 50-55% by visual estimate.   Recommend dual antiplatelet therapy. PCI of mid LCX as has restenosis of LCX, moderate mid LAD and distal RCA and osteal LAD 50%. Advise PCI of LCX. Admit.     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  Patient will have PCI of the circumflex and will be admitted overnight for observation.  Fremon Zacharia Welton Flakes

## 2023-03-26 NOTE — H&P (Addendum)
History and Physical    PatientHussein May ZOX:[id number] DOB: 17-Oct-1956 DOA: 03/26/2023 DOS: the patient was seen and examined on 03/26/2023 PCP: Orson Eva, NP  Patient coming from: Home  Chief Complaint: No chief complaint on file.  HPI: Jesse May is a 67 y.o. male with medical history significant of coronary artery disease status post stenting, hypertension, hyperlipidemia, hypothyroidism, GERD presenting with chest pain and coronary artery occlusion.  Patient ports baseline history of coronary artery disease status post stenting in 2007.  Has had recurring chest pain akin to unstable angina over the past month or so.  Has been formally evaluated by Dr. Lennette Bihari.  Noted recent stress test as well as coronary CT that was abnormal.  Patient had coronary angiography today which showed a restenosis of the left circumflex stent.  Is pending procedural intervention by Dr. Juliann Pares.  Patient denies any fevers or chills.  No nausea or vomiting.  No shortness of breath.  Patient is a non-smoker.  Denies any alcohol use.  Has been compliant with home aspirin and Plavix use. Currently in the Cath Lab afebrile, heart rate into the 50s, BP stable.  Satting well on room air.  CBC, CMP are pending.  Lipoprotein as well as HIV are pending. Review of Systems: As mentioned in the history of present illness. All other systems reviewed and are negative. Past Medical History:  Diagnosis Date   MI (myocardial infarction) [facility])    Past Surgical History:  Procedure Laterality Date   APPENDECTOMY     CHOLECYSTECTOMY     TONSILLECTOMY     Social History:  reports that he has never smoked. He has never used smokeless tobacco. He reports that he does not currently use alcohol. He reports that he does not currently use drugs.  Allergies  Allergen Reactions   Gabapentin     Note: visual disturbances    History reviewed. No pertinent family history.  Prior to Admission medications   Medication Sig Start Date  End Date Taking? Authorizing Provider  amoxicillin-clavulanate (AUGMENTIN) 875-125 MG tablet Take 1 tablet by mouth 2 (two) times daily. Patient not taking: Reported on 03/20/2023 02/09/23   Orson Eva, NP  Aspirin 81 MG CAPS Aspirin 81 MG Oral Capsule QTY: 0 capsule Days: 0 Refills: 0  Written: 02/07/21 Patient Instructions: 02/07/21   [provider]  clopidogrel (PLAVIX) 75 MG tablet Take 1 tablet (75 mg total) by mouth daily. 03/13/23 03/12/24  Laurier Nancy, MD  isosorbide mononitrate (IMDUR) 30 MG 24 hr tablet Take 1 tablet (30 mg total) by mouth daily. 03/13/23   Laurier Nancy, MD  levothyroxine (SYNTHROID) 137 MCG tablet Take 137 mcg by mouth daily before breakfast.    [provider]  losartan (COZAAR) 50 MG tablet Losartan Potassium 50 MG Oral Tablet QTY: 90 tablet Days: 90 Refills: 3  Written: 05/29/22 Patient Instructions: Take 1 tablet by mouth daily in AM for BP 05/29/22   [provider]  metoprolol succinate (TOPROL XL) 25 MG 24 hr tablet Take 1 tablet (25 mg total) by mouth daily. 03/13/23   Laurier Nancy, MD  pantoprazole (PROTONIX) 40 MG tablet Take 1 tablet (40 mg total) by mouth daily. 02/09/23   Orson Eva, NP  rosuvastatin (CRESTOR) 40 MG tablet Take 40 mg by mouth daily.    [provider]    Physical Exam: Vitals:   03/26/23 1126 03/26/23 1130 03/26/23 1145 03/26/23 1200  BP: (!) 180/80 (!) 171/85 (!) 148/84 (!) 153/94  Pulse:  (!) 43 (!) 44 (!) 50  Resp:  17 16 (!) 21  Temp:      TempSrc:      SpO2:  99% 99% 100%  Weight:      Height:       Physical Exam Constitutional:      Appearance: He is normal weight.  HENT:     Head: Normocephalic.     Mouth/Throat:     Mouth: Mucous membranes are moist.  Eyes:     Pupils: Pupils are equal, round, and reactive to light.  Cardiovascular:     Rate and Rhythm: Normal rate and regular rhythm.  Pulmonary:     Effort: Pulmonary effort is normal.     Breath sounds: Normal  breath sounds.  Abdominal:     General: Bowel sounds are normal.  Musculoskeletal:        General: Normal range of motion.     Cervical back: Normal range of motion.  Skin:    General: Skin is warm.  Neurological:     General: No focal deficit present.  Psychiatric:        Mood and Affect: Mood normal.     Data Reviewed:  There are no new results to review at this time. CARDIAC CATHETERIZATION   Prox Cx to Mid Cx lesion is 90% stenosed.   Mid LAD lesion is 55% stenosed.   Dist RCA lesion is 45% stenosed.   The left ventricular systolic function is normal.   LV end diastolic pressure is normal.   The left ventricular ejection fraction is 50-55% by visual estimate.   Recommend dual antiplatelet therapy.  PCI of mid LCX as has restenosis of LCX, moderate mid LAD and distal RCA  and osteal LAD 50%. Advise PCI of LCX. Admit.  (PRIOR LABS FROM 5/31)   Lab Results  Component Value Date   WBC 5.7 03/20/2023   HGB 14.7 03/20/2023   HCT 43.7 03/20/2023   MCV 88 03/20/2023   PLT 168 03/20/2023   Last metabolic panel Lab Results  Component Value Date   GLUCOSE 80 03/20/2023   NA 138 03/20/2023   K 4.2 03/20/2023   CL 99 03/20/2023   CO2 25 03/20/2023   BUN 15 03/20/2023   CREATININE 0.99 03/20/2023   EGFR 83 03/20/2023   CALCIUM 9.2 03/20/2023   PROT 6.2 03/20/2023   ALBUMIN 4.4 03/20/2023   LABGLOB 1.8 03/20/2023   AGRATIO 2.4 (H) 03/20/2023   BILITOT 1.3 (H) 03/20/2023   ALKPHOS 82 03/20/2023   AST 16 03/20/2023   ALT 25 03/20/2023   ANIONGAP 9 02/17/2021    Assessment and Plan: * Chest pain Recurrent chest pain x 1 month in the setting of baseline coronary artery disease status post stenting in 2007 + recent abnormal stress test and coronary CT  Had coronary angiography done today with Dr. Welton Flakes with noted area of coronary restenosis of L circumflex pending intervention with Dr. Juliann Pares Cont ASA and plavix  Follow up formal cardiology recommendations       Coronary atherosclerosis of native coronary artery Baseline coronary artery disease status post stenting in 2007 Noted abnormal stress test as well as coronary CT with Dr. Park Breed On aspirin and Plavix Status post coronary angiography today with noted stenosis of the left circumflex pending intervention by Dr. Juliann Pares   Hyperlipidemia, mixed Cont crestor   Benign essential HTN BP stable  Titrate home regimen    Acquired hypothyroidism Cont synthroid    Gastroesophageal  reflux disease PPI      Advance Care Planning:   Code Status: Full Code   Consults: Cardiology w/ Dr. Park Breed   Family Communication: Wife at the bedside   Severity of Illness: The appropriate patient status for this patient is OBSERVATION. Observation status is judged to be reasonable and necessary in order to provide the required intensity of service to ensure the patient's safety. The patient's presenting symptoms, physical exam findings, and initial radiographic and laboratory data in the context of their medical condition is felt to place them at decreased risk for further clinical deterioration. Furthermore, it is anticipated that the patient will be medically stable for discharge from the hospital within 2 midnights of admission.    Greater than 50% was spent in counseling and coordination of care with patient Total encounter time 80 minutes or more  Author: Floydene Flock, MD 03/26/2023 12:32 PM  For on call review [email or web address].

## 2023-03-26 NOTE — Assessment & Plan Note (Signed)
BP stable Titrate home regimen 

## 2023-03-26 NOTE — Assessment & Plan Note (Signed)
PPI ?

## 2023-03-26 NOTE — Assessment & Plan Note (Signed)
Cont crestor °

## 2023-03-26 NOTE — Assessment & Plan Note (Addendum)
Recurrent chest pain x 1 month in the setting of baseline coronary artery disease status post stenting in 2007 + recent abnormal stress test and coronary CT  Had coronary angiography done today with Dr. Welton Flakes with noted area of coronary restenosis of L circumflex pending intervention with Dr. Juliann Pares Cont ASA and plavix  Follow up formal cardiology recommendations

## 2023-03-26 NOTE — Assessment & Plan Note (Signed)
Baseline coronary artery disease status post stenting in 2007 Noted abnormal stress test as well as coronary CT with Dr. Park Breed On aspirin and Plavix Status post coronary angiography today with noted stenosis of the left circumflex pending intervention by Dr. Juliann Pares

## 2023-03-27 ENCOUNTER — Encounter: Payer: Self-pay | Admitting: Cardiovascular Disease

## 2023-03-27 DIAGNOSIS — I200 Unstable angina: Secondary | ICD-10-CM

## 2023-03-27 DIAGNOSIS — I209 Angina pectoris, unspecified: Secondary | ICD-10-CM | POA: Diagnosis not present

## 2023-03-27 DIAGNOSIS — R079 Chest pain, unspecified: Secondary | ICD-10-CM

## 2023-03-27 DIAGNOSIS — Z955 Presence of coronary angioplasty implant and graft: Secondary | ICD-10-CM

## 2023-03-27 DIAGNOSIS — I25110 Atherosclerotic heart disease of native coronary artery with unstable angina pectoris: Secondary | ICD-10-CM | POA: Diagnosis not present

## 2023-03-27 LAB — BASIC METABOLIC PANEL
Anion gap: 7 (ref 5–15)
BUN: 21 mg/dL (ref 8–23)
CO2: 25 mmol/L (ref 22–32)
Calcium: 8.7 mg/dL — ABNORMAL LOW (ref 8.9–10.3)
Chloride: 105 mmol/L (ref 98–111)
Creatinine, Ser: 0.93 mg/dL (ref 0.61–1.24)
GFR, Estimated: >60 mL/min (ref >60)
Glucose, Bld: 118 mg/dL — ABNORMAL HIGH (ref 70–99)
Potassium: 3.6 mmol/L (ref 3.5–5.1)
Sodium: 137 mmol/L (ref 135–145)

## 2023-03-27 LAB — CBC
HCT: 45.5 % (ref 39.0–52.0)
Hemoglobin: 15.4 g/dL (ref 13.0–17.0)
MCH: 29.3 pg (ref 26.0–34.0)
MCHC: 33.8 g/dL (ref 30.0–36.0)
MCV: 86.5 fL (ref 80.0–100.0)
Platelets: 179 10*3/uL (ref 150–400)
RBC: 5.26 MIL/uL (ref 4.22–5.81)
RDW: 13.4 % (ref 11.5–15.5)
WBC: 6.2 10*3/uL (ref 4.0–10.5)
nRBC: 0 % (ref 0.0–0.2)

## 2023-03-27 LAB — HIV ANTIBODY (ROUTINE TESTING W REFLEX): HIV Screen 4th Generation wRfx: NONREACTIVE

## 2023-03-27 NOTE — Progress Notes (Signed)
Transition of Care [facility]) - Inpatient Brief Assessment   Patient Details  Name: Jesse May MRN: [id number] Date of Birth: 05/14/1956  [facility] CM/SW Contact:    Truddie Hidden, RN Phone Number: 03/27/2023, 12:00 PM   Clinical Narrative: TOC continues to provide ongoing  assessment for needs and discharge planning.   Transition of Care Asessment: Insurance and Status: Insurance coverage has been reviewed Patient has primary care physician: Yes Home environment has been reviewed: Plans to return home Prior level of function:: Independent Prior/Current Home Services: No current home services Social Determinants of Health Reivew: SDOH reviewed no interventions necessary Readmission risk has been reviewed: Yes Transition of care needs: no transition of care needs at this time

## 2023-03-27 NOTE — Progress Notes (Signed)
SUBJECTIVE: Patient denies any chest pain or shortness of breath   Vitals:   03/26/23 1850 03/26/23 2019 03/26/23 2310 03/27/23 0632  BP: (!) 161/81 133/79 (!) 146/79 130/65  Pulse: 63 63 61 (!) 53  Resp: 16 18 18 18   Temp: 98.2 F (36.8 C) 98.8 F (37.1 C) 98.7 F (37.1 C) 98.1 F (36.7 C)  TempSrc: Oral     SpO2: 97% 98% 98% 99%  Weight:      Height:        Intake/Output Summary (Last 24 hours) at 03/27/2023 1207 Last data filed at 03/27/2023 1100 Gross per 24 hour  Intake 240 ml  Output 1925 ml  Net -1685 ml    LABS: Basic Metabolic Panel: Recent Labs    03/26/23 1904 03/27/23 0536  NA 138 137  K 3.5 3.6  CL 103 105  CO2 27 25  GLUCOSE 90 118*  BUN 16 21  CREATININE 0.88 0.93  CALCIUM 8.8* 8.7*   Liver Function Tests: No results for input(s): "AST", "ALT", "ALKPHOS", "BILITOT", "PROT", "ALBUMIN" in the last 72 hours. No results for input(s): "LIPASE", "AMYLASE" in the last 72 hours. CBC: Recent Labs    03/26/23 1904 03/27/23 0536  WBC 7.7 6.2  HGB 16.9 15.4  HCT 50.0 45.5  MCV 85.5 86.5  PLT 183 179   Cardiac Enzymes: No results for input(s): "CKTOTAL", "CKMB", "CKMBINDEX", "TROPONINI" in the last 72 hours. BNP: Invalid input(s): "POCBNP" D-Dimer: No results for input(s): "DDIMER" in the last 72 hours. Hemoglobin A1C: No results for input(s): "HGBA1C" in the last 72 hours. Fasting Lipid Panel: No results for input(s): "CHOL", "HDL", "LDLCALC", "TRIG", "CHOLHDL", "LDLDIRECT" in the last 72 hours. Thyroid Function Tests: No results for input(s): "TSH", "T4TOTAL", "T3FREE", "THYROIDAB" in the last 72 hours.  Invalid input(s): "FREET3" Anemia Panel: No results for input(s): "VITAMINB12", "FOLATE", "FERRITIN", "TIBC", "IRON", "RETICCTPCT" in the last 72 hours.   PHYSICAL EXAM General: Well developed, well nourished, in no acute distress HEENT:  Normocephalic and atramatic Neck:  No JVD.  Lungs: Clear bilaterally to auscultation and  percussion. Heart: HRRR . Normal S1 and S2 without gallops or murmurs.  Abdomen: Bowel sounds are positive, abdomen soft and non-tender  Msk:  Back normal, normal gait. Normal strength and tone for age. Extremities: No clubbing, cyanosis or edema.   Neuro: Alert and oriented X 3. Psych:  Good affect, responds appropriately  TELEMETRY: Sinus bradycardia  ASSESSMENT AND PLAN: Unstable angina status post 90% restenosis in the mid left circumflex stent requiring PCI and stenting.  Patient is feeling much better after PCI however his heart rate was in the 40s as agree with stopping metoprolol.  Patient can go home on his home medications other than metoprolol and follow-up this Tuesday at 9 AM.   ICD-10-CM   1. Other chest pain  R07.89 CARDIAC CATHETERIZATION    CARDIAC CATHETERIZATION    aspirin chewable tablet 81 mg    Basic metabolic panel    CBC    Basic metabolic panel    CBC    DISCONTINUED: 0.9% sodium chloride infusion    DISCONTINUED: 0.9% sodium chloride infusion    DISCONTINUED: sodium chloride flush (NS) 0.9 % injection 3 mL    DISCONTINUED: sodium chloride flush (NS) 0.9 % injection 3 mL    DISCONTINUED: 0.9 %  sodium chloride infusion      Principal Problem:   Progressive angina (HCC) Active Problems:   Angina, class III (HCC)   Gastroesophageal reflux disease  Acquired hypothyroidism   Essential hypertension   Coronary artery disease involving native heart with angina pectoris (HCC)   Hyperlipidemia, mixed   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

## 2023-03-27 NOTE — Discharge Summary (Signed)
Physician Discharge Summary   Patient: Jesse May MRN: [id number] DOB: 1956/10/14  Admit date:     03/26/2023  Discharge date: 03/27/2023  Discharge Physician: Lurene Shadow   PCP: Orson Eva, NP   Recommendations at discharge:   Follow-up with Dr. 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 *  Hospital Course:  Jesse May is a 67 y.o. male with medical history significant of coronary artery disease status post stenting, hypertension, hyperlipidemia, hypothyroidism, GERD presenting with chest pain and coronary artery occlusion.  Patient ports baseline history of coronary artery disease status post stenting in 2007.  Has had recurring chest pain akin to unstable angina over the past month or so.  Has been formally evaluated by Dr. Park Breed.  Noted recent stress test as well as coronary CT that were abnormal.  He had left heart catheterization which showed 90% stenosis in proximal circumflex to mid circumflex.  He underwent successful balloon angioplasty/PTCA of left circumflex stent.  He was admitted to the hospital for observation.  Chest pain has improved.  However patient was noted to have sinus bradycardia with heart rate in the 40s on telemetry.  He was on metoprolol prior to admission so this was discontinued.  He is deemed stable for discharge to home today.  Discharge plan was discussed with Dr. Welton Flakes, cardiologist.       Consultants: Cardiologist Procedures performed: Coronary  angioplasty Disposition: Home Diet recommendation:  Discharge Diet Orders (From admission, onward)     Start     Ordered   03/27/23 0000  Diet - low sodium heart healthy        03/27/23 1052           Cardiac diet DISCHARGE MEDICATION: Allergies as of 03/27/2023       Reactions   Gabapentin    Note: visual disturbances        Medication List     STOP taking these medications    amoxicillin-clavulanate 875-125 MG tablet Commonly known as: AUGMENTIN   metoprolol succinate 25 MG 24 hr tablet Commonly known as: Toprol XL       TAKE these medications    Aspirin 81 MG Caps Aspirin 81 MG Oral Capsule QTY: 0 capsule Days: 0 Refills: 0  Written: 02/07/21 Patient Instructions:   clopidogrel 75 MG tablet Commonly known as: Plavix Take 1 tablet (75 mg total) by mouth daily.   isosorbide mononitrate 30 MG 24 hr tablet Commonly known as: IMDUR Take 1 tablet (30 mg total) by mouth daily.   levothyroxine 137 MCG tablet Commonly known as: SYNTHROID Take 137 mcg by mouth daily before breakfast.   losartan 50 MG tablet Commonly known as: COZAAR Losartan Potassium 50 MG Oral Tablet QTY: 90 tablet Days: 90 Refills: 3  Written: 05/29/22 Patient Instructions: Take 1 tablet by mouth daily in AM for BP   pantoprazole 40 MG tablet Commonly known as: PROTONIX Take 1 tablet (40 mg total) by mouth daily.   rosuvastatin 40 MG tablet Commonly known as: CRESTOR Take 40 mg by  mouth daily.        Discharge Exam: Filed Weights   03/26/23 0846  Weight: 94.8 kg   GEN: NAD SKIN: Warm and dry EYES: EOMI ENT: MMM CV: RRR PULM: CTA B ABD: soft, ND, NT, +BS CNS: AAO x 3, non focal EXT: No edema or tenderness   Condition at discharge: good  The results of significant diagnostics from this hospitalization (including imaging, microbiology, ancillary and laboratory) are listed below for reference.   Imaging Studies: CARDIAC CATHETERIZATION  Result Date: 03/27/2023    CULPRIT LESION the previously placed proximal to mid Cx stent has a focal segment that is 90% stenosed.   Scoring balloon angioplasty was performed using a BALLN SCOREFLEX 2.75X15.   Post intervention, there is a 0% residual stenosis. Successful scoring balloon angioplasty/PTCA of 90% ISR of LCx stent reducing 90% to 0% with TIMI-3 flow pre and post. PLAN The patient will be admitted for outpatient with extended stay post PCI on the hospital service to be discharged at the discretion of the hospitalist and Dr. Welton Flakes -> Per RECOMMENDATIONS Continue to treat with GDMT for CAD DAPT per RECOMMENDATIONS section Bryan Lemma, MD  CARDIAC CATHETERIZATION  Result Date: 03/26/2023   Prox Cx to Mid Cx lesion is 90% stenosed.   Mid LAD lesion is 55% stenosed.   Dist RCA lesion is 45% stenosed.   The left ventricular systolic function is normal.   LV end diastolic pressure is normal.   The left ventricular ejection fraction is 50-55% by visual estimate.   Recommend dual antiplatelet therapy. PCI of mid LCX as has restenosis of LCX, moderate mid LAD and distal RCA and osteal LAD 50%. Advise PCI of LCX. Admit.    Microbiology: No results found for this or any previous visit.  Labs: CBC: Recent Labs  Lab 03/20/23 1328 03/26/23 1904 03/27/23 0536  WBC 5.7 7.7 6.2  NEUTROABS 4.1  --   --   HGB 14.7 16.9 15.4  HCT 43.7 50.0 45.5  MCV 88 85.5 86.5  PLT 168 183 179   Basic Metabolic Panel: Recent Labs  Lab 03/20/23 1340 03/26/23 1904 03/27/23 0536  NA 138 138 137  K 4.2 3.5 3.6  CL 99 103 105  CO2 25 27 25   GLUCOSE 80 90 118*  BUN 15 16 21   CREATININE 0.99 0.88 0.93  CALCIUM 9.2 8.8* 8.7*   Liver Function Tests: Recent Labs  Lab 03/20/23 1340  AST 16  ALT 25  ALKPHOS 82  BILITOT 1.3*  PROT 6.2  ALBUMIN 4.4   CBG: No results for input(s): "GLUCAP" in the last 168 hours.  Discharge time spent: greater than 30 minutes.  Signed: Lurene Shadow, MD [facility] 03/27/2023

## 2023-03-27 NOTE — Progress Notes (Signed)
Patient can be discharged with follow up next Tuesday 9 AM in my office.

## 2023-03-30 LAB — LIPOPROTEIN A (LPA): Lipoprotein (a): 78.8 nmol/L — ABNORMAL HIGH (ref <75.0)

## 2023-03-31 ENCOUNTER — Ambulatory Visit (INDEPENDENT_AMBULATORY_CARE_PROVIDER_SITE_OTHER): Payer: Managed Care, Other (non HMO) | Admitting: Cardiovascular Disease

## 2023-03-31 ENCOUNTER — Encounter: Payer: Self-pay | Admitting: Cardiovascular Disease

## 2023-03-31 VITALS — BP 142/80 | HR 60 | Ht 74.0 in | Wt 202.0 lb

## 2023-03-31 DIAGNOSIS — I209 Angina pectoris, unspecified: Secondary | ICD-10-CM

## 2023-03-31 DIAGNOSIS — Z955 Presence of coronary angioplasty implant and graft: Secondary | ICD-10-CM

## 2023-03-31 DIAGNOSIS — I10 Essential (primary) hypertension: Secondary | ICD-10-CM | POA: Diagnosis not present

## 2023-03-31 DIAGNOSIS — I25119 Atherosclerotic heart disease of native coronary artery with unspecified angina pectoris: Secondary | ICD-10-CM

## 2023-03-31 NOTE — Progress Notes (Signed)
Cardiology Office Note   Date:  03/31/2023   ID:  [facility], DOB Nov 01, 1955, MRN [id number]  PCP:  Orson Eva, NP  Cardiologist:  Adrian Blackwater, MD      History of Present Illness: Jesse May is a 67 y.o. male who presents for  Chief Complaint  Patient presents with   Follow-up    2 week follow up    No further chest pain after PCI of LCX stent as had restenosis.      Past Medical History:  Diagnosis Date   MI (myocardial infarction) [facility])      Past Surgical History:  Procedure Laterality Date   APPENDECTOMY     CARDIAC CATHETERIZATION  2015   stent left circumflex-per Dr. Leonard Schwartz. Gwen Pounds note Care Everywhere   CHOLECYSTECTOMY     CORONARY BALLOON ANGIOPLASTY N/A 03/26/2023   Procedure: CORONARY BALLOON ANGIOPLASTY;  Surgeon: Marykay Lex, MD;  Location: [facility] INVASIVE CV LAB;  Service: Cardiovascular;  Laterality: N/A;   LEFT HEART CATH AND CORONARY ANGIOGRAPHY Left 03/26/2023   Procedure: LEFT HEART CATH AND CORONARY ANGIOGRAPHY;  Surgeon: Laurier Nancy, MD;  Location: [facility] INVASIVE CV LAB;  Service: Cardiovascular;  Laterality: Left;   TONSILLECTOMY       Current Outpatient Medications  Medication Sig Dispense Refill   Aspirin 81 MG CAPS Aspirin 81 MG Oral Capsule QTY: 0 capsule Days: 0 Refills: 0  Written: 02/07/21 Patient Instructions:     clopidogrel (PLAVIX) 75 MG tablet Take 1 tablet (75 mg total) by mouth daily. 30 tablet 11   isosorbide mononitrate (IMDUR) 30 MG 24 hr tablet Take 1 tablet (30 mg total) by mouth daily. 30 tablet 1   levothyroxine (SYNTHROID) 137 MCG tablet Take 137 mcg by mouth daily before breakfast.     losartan (COZAAR) 50 MG tablet Losartan Potassium 50 MG Oral Tablet QTY: 90 tablet Days: 90 Refills: 3  Written: 05/29/22 Patient Instructions: Take 1 tablet by mouth daily in AM for BP     pantoprazole (PROTONIX) 40 MG tablet Take 1 tablet (40 mg total) by mouth daily. 90 tablet 1   rosuvastatin (CRESTOR) 40 MG tablet Take 40 mg by  mouth daily.     No current facility-administered medications for this visit.   Facility-Administered Medications Ordered in Other Visits  Medication Dose Route Frequency Provider Last Rate Last Admin   sodium chloride flush (NS) 0.9 % injection 3 mL  3 mL Intravenous Q12H Hammock, Sheri, NP        Allergies:   Gabapentin    Social History:   reports that he has never smoked. He has never used smokeless tobacco. He reports that he does not currently use alcohol. He reports that he does not currently use drugs.   Family History:  family history is not on file.    ROS:     Review of Systems  Constitutional: Negative.   HENT: Negative.    Eyes: Negative.   Respiratory: Negative.    Gastrointestinal: Negative.   Genitourinary: Negative.   Musculoskeletal: Negative.   Skin: Negative.   Neurological: Negative.   Endo/Heme/Allergies: Negative.   Psychiatric/Behavioral: Negative.    All other systems reviewed and are negative.     All other systems are reviewed and negative.    PHYSICAL EXAM: VS:  BP (!) 142/80   Pulse 60   Ht 6\' 2"  (1.88 m)   Wt 202 lb (91.6 kg)   SpO2 97%   BMI 25.94 kg/m  ,  BMI Body mass index is 25.94 kg/m. Last weight:  Wt Readings from Last 3 Encounters:  03/31/23 202 lb (91.6 kg)  03/26/23 209 lb (94.8 kg)  03/20/23 207 lb (93.9 kg)     Physical Exam Vitals reviewed.  Constitutional:      Appearance: Normal appearance. He is normal weight.  HENT:     Head: Normocephalic.     Nose: Nose normal.     Mouth/Throat:     Mouth: Mucous membranes are moist.  Eyes:     Pupils: Pupils are equal, round, and reactive to light.  Cardiovascular:     Rate and Rhythm: Normal rate and regular rhythm.     Pulses: Normal pulses.     Heart sounds: Normal heart sounds.  Pulmonary:     Effort: Pulmonary effort is normal.  Abdominal:     General: Abdomen is flat. Bowel sounds are normal.  Musculoskeletal:        General: Normal range of motion.      Cervical back: Normal range of motion.  Skin:    General: Skin is warm.  Neurological:     General: No focal deficit present.     Mental Status: He is alert.  Psychiatric:        Mood and Affect: Mood normal.       EKG:   Recent Labs: 01/23/2023: TSH 5.020 03/20/2023: ALT 25 03/27/2023: BUN 21; Creatinine, Ser 0.93; Hemoglobin 15.4; Platelets 179; Potassium 3.6; Sodium 137    Lipid Panel    Component Value Date/Time   CHOL 92 (L) 01/23/2023 1115   TRIG 64 01/23/2023 1115   HDL 37 (L) 01/23/2023 1115   CHOLHDL 2.5 01/23/2023 1115   LDLCALC 41 01/23/2023 1115      Other studies Reviewed: Additional studies/ records that were reviewed today include:  Review of the above records demonstrates:       No data to display            ASSESSMENT AND PLAN:    ICD-10-CM   1. 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 Doing well post PCI    2. Angina, class III (HCC)  I20.9     3. Coronary artery disease involving native coronary artery of native heart with angina pectoris (HCC)  I25.119     4. 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    Total time spent: 30 minutes  Signed,  Adrian Blackwater, MD  03/31/2023 9:18 AM    [facility]

## 2023-04-03 ENCOUNTER — Ambulatory Visit: Payer: Managed Care, Other (non HMO) | Admitting: Cardiovascular Disease

## 2023-04-06 ENCOUNTER — Encounter: Payer: Self-pay | Admitting: Cardiovascular Disease

## 2023-04-07 ENCOUNTER — Ambulatory Visit: Admit: 2023-04-07 | Payer: Medicare Other | Admitting: Cardiovascular Disease

## 2023-04-07 ENCOUNTER — Encounter: Payer: Self-pay | Admitting: Cardiovascular Disease

## 2023-04-07 ENCOUNTER — Ambulatory Visit: Payer: Self-pay | Admitting: Cardiovascular Disease

## 2023-04-07 ENCOUNTER — Ambulatory Visit (INDEPENDENT_AMBULATORY_CARE_PROVIDER_SITE_OTHER): Payer: Managed Care, Other (non HMO) | Admitting: Cardiovascular Disease

## 2023-04-07 VITALS — BP 140/70 | HR 65 | Ht 74.0 in | Wt 205.6 lb

## 2023-04-07 DIAGNOSIS — I209 Angina pectoris, unspecified: Secondary | ICD-10-CM | POA: Diagnosis not present

## 2023-04-07 DIAGNOSIS — I25118 Atherosclerotic heart disease of native coronary artery with other forms of angina pectoris: Secondary | ICD-10-CM

## 2023-04-07 DIAGNOSIS — S301XXA Contusion of abdominal wall, initial encounter: Secondary | ICD-10-CM

## 2023-04-07 DIAGNOSIS — I25119 Atherosclerotic heart disease of native coronary artery with unspecified angina pectoris: Secondary | ICD-10-CM

## 2023-04-07 DIAGNOSIS — I10 Essential (primary) hypertension: Secondary | ICD-10-CM

## 2023-04-07 SURGERY — LEFT HEART CATH AND CORONARY ANGIOGRAPHY
Anesthesia: Moderate Sedation

## 2023-04-07 MED ORDER — SODIUM CHLORIDE 0.9% FLUSH
3.0000 mL | Freq: Two times a day (BID) | INTRAVENOUS | Status: DC
Start: 2023-04-07 — End: 2023-04-28

## 2023-04-07 NOTE — Progress Notes (Signed)
Cardiology Office Note   Date:  04/07/2023   ID:  [facility], DOB 12/07/1955, MRN [id number]  PCP:  Orson Eva, NP  Cardiologist:  Adrian Blackwater, MD      History of Present Illness: Jesse May is a 67 y.o. male who presents for  Chief Complaint  Patient presents with   Acute Visit    Lump In Vein    Patient called and stated that he had erythema on his arms and groin with the cardiac cath was done.  On evaluation there is no significant swelling or erythema both in right forearm or in the right groin.      Past Medical History:  Diagnosis Date   MI (myocardial infarction) [facility])      Past Surgical History:  Procedure Laterality Date   APPENDECTOMY     CARDIAC CATHETERIZATION  2015   stent left circumflex-per Dr. Leonard Schwartz. Gwen Pounds note Care Everywhere   CHOLECYSTECTOMY     CORONARY BALLOON ANGIOPLASTY N/A 03/26/2023   Procedure: CORONARY BALLOON ANGIOPLASTY;  Surgeon: Marykay Lex, MD;  Location: [facility] INVASIVE CV LAB;  Service: Cardiovascular;  Laterality: N/A;   LEFT HEART CATH AND CORONARY ANGIOGRAPHY Left 03/26/2023   Procedure: LEFT HEART CATH AND CORONARY ANGIOGRAPHY;  Surgeon: Laurier Nancy, MD;  Location: [facility] INVASIVE CV LAB;  Service: Cardiovascular;  Laterality: Left;   TONSILLECTOMY       Current Outpatient Medications  Medication Sig Dispense Refill   Aspirin 81 MG CAPS Aspirin 81 MG Oral Capsule QTY: 0 capsule Days: 0 Refills: 0  Written: 02/07/21 Patient Instructions:     clopidogrel (PLAVIX) 75 MG tablet Take 1 tablet (75 mg total) by mouth daily. 30 tablet 11   isosorbide mononitrate (IMDUR) 30 MG 24 hr tablet Take 1 tablet (30 mg total) by mouth daily. 30 tablet 1   levothyroxine (SYNTHROID) 137 MCG tablet Take 137 mcg by mouth daily before breakfast.     losartan (COZAAR) 50 MG tablet Losartan Potassium 50 MG Oral Tablet QTY: 90 tablet Days: 90 Refills: 3  Written: 05/29/22 Patient Instructions: Take 1 tablet by mouth daily in AM for BP      pantoprazole (PROTONIX) 40 MG tablet Take 1 tablet (40 mg total) by mouth daily. 90 tablet 1   rosuvastatin (CRESTOR) 40 MG tablet Take 40 mg by mouth daily.     No current facility-administered medications for this visit.   Facility-Administered Medications Ordered in Other Visits  Medication Dose Route Frequency Provider Last Rate Last Admin   sodium chloride flush (NS) 0.9 % injection 3 mL  3 mL Intravenous Q12H Hammock, Sheri, NP        Allergies:   Gabapentin    Social History:   reports that he has never smoked. He has never used smokeless tobacco. He reports that he does not currently use alcohol. He reports that he does not currently use drugs.   Family History:  family history is not on file.    ROS:     Review of Systems  Constitutional: Negative.   HENT: Negative.    Eyes: Negative.   Respiratory: Negative.    Gastrointestinal: Negative.   Genitourinary: Negative.   Musculoskeletal: Negative.   Skin: Negative.   Neurological: Negative.   Endo/Heme/Allergies: Negative.   Psychiatric/Behavioral: Negative.    All other systems reviewed and are negative.     All other systems are reviewed and negative.    PHYSICAL EXAM: VS:  BP (!) 140/70   Pulse  65   Ht 6\' 2"  (1.88 m)   Wt 205 lb 9.6 oz (93.3 kg)   SpO2 97%   BMI 26.40 kg/m  , BMI Body mass index is 26.4 kg/m. Last weight:  Wt Readings from Last 3 Encounters:  04/07/23 205 lb 9.6 oz (93.3 kg)  03/31/23 202 lb (91.6 kg)  03/26/23 209 lb (94.8 kg)     Physical Exam Vitals reviewed.  Constitutional:      Appearance: Normal appearance. He is normal weight.  HENT:     Head: Normocephalic.     Nose: Nose normal.     Mouth/Throat:     Mouth: Mucous membranes are moist.  Eyes:     Pupils: Pupils are equal, round, and reactive to light.  Cardiovascular:     Rate and Rhythm: Normal rate and regular rhythm.     Pulses: Normal pulses.     Heart sounds: Normal heart sounds.  Pulmonary:     Effort:  Pulmonary effort is normal.  Abdominal:     General: Abdomen is flat. Bowel sounds are normal.     Tenderness: There is no abdominal tenderness. There is no rebound.     Comments: No significant erythema in the right groin  Musculoskeletal:        General: Normal range of motion.     Cervical back: Normal range of motion.  Skin:    General: Skin is warm.  Neurological:     General: No focal deficit present.     Mental Status: He is alert.  Psychiatric:        Mood and Affect: Mood normal.       EKG:   Recent Labs: 01/23/2023: TSH 5.020 03/20/2023: ALT 25 03/27/2023: BUN 21; Creatinine, Ser 0.93; Hemoglobin 15.4; Platelets 179; Potassium 3.6; Sodium 137    Lipid Panel    Component Value Date/Time   CHOL 92 (L) 01/23/2023 1115   TRIG 64 01/23/2023 1115   HDL 37 (L) 01/23/2023 1115   CHOLHDL 2.5 01/23/2023 1115   LDLCALC 41 01/23/2023 1115      Other studies Reviewed: Additional studies/ records that were reviewed today include:  Review of the above records demonstrates:       No data to display            ASSESSMENT AND PLAN:    ICD-10-CM   1. Angina, class III (HCC)  I20.9     2. Coronary artery disease involving native coronary artery of native heart with other form of angina pectoris (HCC)  I25.118    Patient is not having any chest pain.    3. Essential hypertension  I10     4. Coronary artery disease involving native coronary artery of native heart with angina pectoris (HCC)  I25.119     5. Hematoma of groin, initial encounter  S30.1XXA    No significant swelling with no bruit       Problem List Items Addressed This Visit       Cardiovascular and Mediastinum   Essential hypertension (Chronic)   Coronary artery disease involving native heart with angina pectoris (HCC) (Chronic)   Angina, class III (HCC) - Primary   Coronary artery disease   Other Visit Diagnoses     Hematoma of groin, initial encounter       No significant swelling with no  bruit          Disposition:   No follow-ups on file.    Total time spent: 30 minutes  Signed,  Adrian Blackwater, MD  04/07/2023 2:54 PM    [facility]

## 2023-04-15 ENCOUNTER — Other Ambulatory Visit: Payer: Self-pay | Admitting: Nurse Practitioner

## 2023-04-15 DIAGNOSIS — J0110 Acute frontal sinusitis, unspecified: Secondary | ICD-10-CM

## 2023-04-28 ENCOUNTER — Encounter: Payer: Self-pay | Admitting: Cardiovascular Disease

## 2023-04-28 ENCOUNTER — Ambulatory Visit (INDEPENDENT_AMBULATORY_CARE_PROVIDER_SITE_OTHER): Payer: Managed Care, Other (non HMO) | Admitting: Cardiovascular Disease

## 2023-04-28 ENCOUNTER — Ambulatory Visit: Payer: Managed Care, Other (non HMO) | Admitting: Cardiovascular Disease

## 2023-04-28 VITALS — BP 162/84 | HR 68 | Ht 74.0 in | Wt 212.0 lb

## 2023-04-28 DIAGNOSIS — R0602 Shortness of breath: Secondary | ICD-10-CM

## 2023-04-28 DIAGNOSIS — I10 Essential (primary) hypertension: Secondary | ICD-10-CM

## 2023-04-28 DIAGNOSIS — I209 Angina pectoris, unspecified: Secondary | ICD-10-CM

## 2023-04-28 DIAGNOSIS — Z9861 Coronary angioplasty status: Secondary | ICD-10-CM

## 2023-04-28 DIAGNOSIS — I25119 Atherosclerotic heart disease of native coronary artery with unspecified angina pectoris: Secondary | ICD-10-CM

## 2023-04-28 DIAGNOSIS — I2510 Atherosclerotic heart disease of native coronary artery without angina pectoris: Secondary | ICD-10-CM | POA: Diagnosis not present

## 2023-04-28 DIAGNOSIS — R0789 Other chest pain: Secondary | ICD-10-CM

## 2023-04-28 DIAGNOSIS — I25118 Atherosclerotic heart disease of native coronary artery with other forms of angina pectoris: Secondary | ICD-10-CM

## 2023-04-28 MED ORDER — NITROGLYCERIN 0.4 MG SL SUBL: 0.4000 mg | SUBLINGUAL_TABLET | SUBLINGUAL | 3 refills | Status: DC | PRN

## 2023-04-28 NOTE — Progress Notes (Signed)
Cardiology Office Note   Date:  04/28/2023   ID:  Kwasi Joung, DOB 01-20-1956, MRN [id number]  PCP:  Orson Eva, NP  Cardiologist:  Adrian Blackwater, MD      History of Present Illness: Jesse May is a 67 y.o. male who presents for  Chief Complaint  Patient presents with   Follow-up    Follow Up 1 month    Chest Pain  This is a chronic problem. The current episode started 1 to 4 weeks ago. The onset quality is sudden. The problem has been resolved. The pain is present in the lateral region. The pain is at a severity of 3/10. The pain is mild. Associated symptoms include shortness of breath.  Shortness of Breath This is a chronic problem. The current episode started more than 1 month ago. The problem has been resolved. Associated symptoms include chest pain.      Past Medical History:  Diagnosis Date   MI (myocardial infarction) [facility])      Past Surgical History:  Procedure Laterality Date   APPENDECTOMY     CARDIAC CATHETERIZATION  2015   stent left circumflex-per Dr. Leonard Schwartz. Gwen Pounds note Care Everywhere   CHOLECYSTECTOMY     CORONARY BALLOON ANGIOPLASTY N/A 03/26/2023   Procedure: CORONARY BALLOON ANGIOPLASTY;  Surgeon: Marykay Lex, MD;  Location: [facility] INVASIVE CV LAB;  Service: Cardiovascular;  Laterality: N/A;   LEFT HEART CATH AND CORONARY ANGIOGRAPHY Left 03/26/2023   Procedure: LEFT HEART CATH AND CORONARY ANGIOGRAPHY;  Surgeon: Laurier Nancy, MD;  Location: [facility] INVASIVE CV LAB;  Service: Cardiovascular;  Laterality: Left;   TONSILLECTOMY       Current Outpatient Medications  Medication Sig Dispense Refill   nitroGLYCERIN (NITROSTAT) 0.4 MG SL tablet Place 1 tablet (0.4 mg total) under the tongue every 5 (five) minutes as needed for chest pain. 100 tablet 3   Aspirin 81 MG CAPS Aspirin 81 MG Oral Capsule QTY: 0 capsule Days: 0 Refills: 0  Written: 02/07/21 Patient Instructions:     clopidogrel (PLAVIX) 75 MG tablet Take 1 tablet (75 mg total) by mouth daily.  30 tablet 11   isosorbide mononitrate (IMDUR) 30 MG 24 hr tablet Take 1 tablet (30 mg total) by mouth daily. 30 tablet 1   levothyroxine (SYNTHROID) 137 MCG tablet Take 137 mcg by mouth daily before breakfast.     losartan (COZAAR) 50 MG tablet Losartan Potassium 50 MG Oral Tablet QTY: 90 tablet Days: 90 Refills: 3  Written: 05/29/22 Patient Instructions: Take 1 tablet by mouth daily in AM for BP     pantoprazole (PROTONIX) 40 MG tablet Take 1 tablet (40 mg total) by mouth daily. 90 tablet 1   rosuvastatin (CRESTOR) 40 MG tablet Take 40 mg by mouth daily.     No current facility-administered medications for this visit.    Allergies:   Gabapentin    Social History:   reports that he has never smoked. He has never used smokeless tobacco. He reports that he does not currently use alcohol. He reports that he does not currently use drugs.   Family History:  family history is not on file.    ROS:     Review of Systems  Constitutional: Negative.   HENT: Negative.    Eyes: Negative.   Respiratory:  Positive for shortness of breath.   Cardiovascular:  Positive for chest pain.  Gastrointestinal: Negative.   Genitourinary: Negative.   Musculoskeletal: Negative.   Skin: Negative.   Neurological: Negative.  Endo/Heme/Allergies: Negative.   Psychiatric/Behavioral: Negative.    All other systems reviewed and are negative.     All other systems are reviewed and negative.    PHYSICAL EXAM: VS:  BP (!) 162/84   Pulse 68   Ht 6\' 2"  (1.88 m)   Wt 212 lb (96.2 kg)   SpO2 96%   BMI 27.22 kg/m  , BMI Body mass index is 27.22 kg/m. Last weight:  Wt Readings from Last 3 Encounters:  04/28/23 212 lb (96.2 kg)  04/07/23 205 lb 9.6 oz (93.3 kg)  03/31/23 202 lb (91.6 kg)     Physical Exam Vitals reviewed.  Constitutional:      Appearance: Normal appearance. He is normal weight.  HENT:     Head: Normocephalic.     Nose: Nose normal.     Mouth/Throat:     Mouth: Mucous membranes  are moist.  Eyes:     Pupils: Pupils are equal, round, and reactive to light.  Cardiovascular:     Rate and Rhythm: Normal rate and regular rhythm.     Pulses: Normal pulses.     Heart sounds: Normal heart sounds.  Pulmonary:     Effort: Pulmonary effort is normal.  Abdominal:     General: Abdomen is flat. Bowel sounds are normal.  Musculoskeletal:        General: Normal range of motion.     Cervical back: Normal range of motion.  Skin:    General: Skin is warm.  Neurological:     General: No focal deficit present.     Mental Status: He is alert.  Psychiatric:        Mood and Affect: Mood normal.       EKG:   Recent Labs: 01/23/2023: TSH 5.020 03/20/2023: ALT 25 03/27/2023: BUN 21; Creatinine, Ser 0.93; Hemoglobin 15.4; Platelets 179; Potassium 3.6; Sodium 137    Lipid Panel    Component Value Date/Time   CHOL 92 (L) 01/23/2023 1115   TRIG 64 01/23/2023 1115   HDL 37 (L) 01/23/2023 1115   CHOLHDL 2.5 01/23/2023 1115   LDLCALC 41 01/23/2023 1115      Other studies Reviewed: Additional studies/ records that were reviewed today include:  Review of the above records demonstrates:       No data to display            ASSESSMENT AND PLAN:    ICD-10-CM   1. CAD S/P percutaneous coronary angioplasty  I25.10    Z98.61    Had PCI/Stenting 90% mid LCX lesion. feels much better    2. Other chest pain  R07.89    less frequent, add nitro    3. SOB (shortness of breath)  R06.02     4. Angina, class III (HCC)  I20.9     5. Coronary artery disease involving native coronary artery of native heart with other form of angina pectoris (HCC)  I25.118     6. Coronary artery disease involving native coronary artery of native heart with angina pectoris (HCC)  I25.119     7. Essential hypertension  I10        Problem List Items Addressed This Visit       Cardiovascular and Mediastinum   Essential hypertension (Chronic)   Relevant Medications   nitroGLYCERIN  (NITROSTAT) 0.4 MG SL tablet   Coronary artery disease involving native heart with angina pectoris (HCC) (Chronic)   Relevant Medications   nitroGLYCERIN (NITROSTAT) 0.4 MG SL tablet  Angina, class III (HCC)   Relevant Medications   nitroGLYCERIN (NITROSTAT) 0.4 MG SL tablet   Coronary artery disease   Relevant Medications   nitroGLYCERIN (NITROSTAT) 0.4 MG SL tablet   Other Visit Diagnoses     CAD S/P percutaneous coronary angioplasty    -  Primary   Had PCI/Stenting 90% mid LCX lesion. feels much better   Relevant Medications   nitroGLYCERIN (NITROSTAT) 0.4 MG SL tablet   Other chest pain       less frequent, add nitro   SOB (shortness of breath)              Disposition:   Return in about 6 weeks (around 06/09/2023).    Total time spent: 30 minutes  Signed,  Adrian Blackwater, MD  04/28/2023 12:03 PM    [facility]

## 2023-05-07 ENCOUNTER — Encounter: Payer: Self-pay | Admitting: Urology

## 2023-05-12 ENCOUNTER — Other Ambulatory Visit: Payer: Self-pay | Admitting: Nurse Practitioner

## 2023-05-20 ENCOUNTER — Other Ambulatory Visit: Payer: Self-pay | Admitting: Cardiovascular Disease

## 2023-05-20 DIAGNOSIS — R0789 Other chest pain: Secondary | ICD-10-CM

## 2023-05-26 ENCOUNTER — Telehealth: Payer: Self-pay | Admitting: *Deleted

## 2023-05-26 NOTE — Telephone Encounter (Signed)
Sent notes to Fax [phone] again . Talked with [place]  and they state they will be working on this as soon as they get fax,

## 2023-06-09 ENCOUNTER — Encounter: Payer: Self-pay | Admitting: Cardiovascular Disease

## 2023-06-09 ENCOUNTER — Ambulatory Visit (INDEPENDENT_AMBULATORY_CARE_PROVIDER_SITE_OTHER): Payer: Managed Care, Other (non HMO) | Admitting: Cardiovascular Disease

## 2023-06-09 VITALS — BP 170/90 | HR 72 | Ht 74.0 in | Wt 214.8 lb

## 2023-06-09 DIAGNOSIS — I10 Essential (primary) hypertension: Secondary | ICD-10-CM

## 2023-06-09 DIAGNOSIS — I200 Unstable angina: Secondary | ICD-10-CM | POA: Diagnosis not present

## 2023-06-09 DIAGNOSIS — E782 Mixed hyperlipidemia: Secondary | ICD-10-CM

## 2023-06-09 DIAGNOSIS — T82855D Stenosis of coronary artery stent, subsequent encounter: Secondary | ICD-10-CM

## 2023-06-09 DIAGNOSIS — I25118 Atherosclerotic heart disease of native coronary artery with other forms of angina pectoris: Secondary | ICD-10-CM | POA: Diagnosis not present

## 2023-06-09 DIAGNOSIS — I25119 Atherosclerotic heart disease of native coronary artery with unspecified angina pectoris: Secondary | ICD-10-CM

## 2023-06-09 DIAGNOSIS — Z955 Presence of coronary angioplasty implant and graft: Secondary | ICD-10-CM

## 2023-06-09 MED ORDER — LOSARTAN POTASSIUM 100 MG PO TABS
100.0000 mg | ORAL_TABLET | Freq: Every day | ORAL | 11 refills | Status: DC
Start: 2023-06-09 — End: 2024-05-12

## 2023-06-09 NOTE — Progress Notes (Signed)
Cardiology Office Note   Date:  06/09/2023   ID:  Jesse May, DOB 04-30-1956, MRN [id number]  PCP:  Orson Eva, NP (Inactive)  Cardiologist:  Adrian Blackwater, MD      History of Present Illness: Jesse May is a 67 y.o. male who presents for  Chief Complaint  Patient presents with   Follow-up    6 WEEK F/U    Doing well  Shortness of Breath This is a chronic problem. The current episode started more than 1 month ago. The problem has been waxing and waning.      Past Medical History:  Diagnosis Date   MI (myocardial infarction) [facility])      Past Surgical History:  Procedure Laterality Date   APPENDECTOMY     CARDIAC CATHETERIZATION  2015   stent left circumflex-per Dr. Leonard Schwartz. Gwen Pounds note Care Everywhere   CHOLECYSTECTOMY     CORONARY BALLOON ANGIOPLASTY N/A 03/26/2023   Procedure: CORONARY BALLOON ANGIOPLASTY;  Surgeon: Marykay Lex, MD;  Location: [facility] INVASIVE CV LAB;  Service: Cardiovascular;  Laterality: N/A;   LEFT HEART CATH AND CORONARY ANGIOGRAPHY Left 03/26/2023   Procedure: LEFT HEART CATH AND CORONARY ANGIOGRAPHY;  Surgeon: Laurier Nancy, MD;  Location: [facility] INVASIVE CV LAB;  Service: Cardiovascular;  Laterality: Left;   TONSILLECTOMY       Current Outpatient Medications  Medication Sig Dispense Refill   losartan (COZAAR) 100 MG tablet Take 1 tablet (100 mg total) by mouth daily. 30 tablet 11   Aspirin 81 MG CAPS Aspirin 81 MG Oral Capsule QTY: 0 capsule Days: 0 Refills: 0  Written: 02/07/21 Patient Instructions:     clopidogrel (PLAVIX) 75 MG tablet Take 1 tablet (75 mg total) by mouth daily. 30 tablet 11   isosorbide mononitrate (IMDUR) 30 MG 24 hr tablet Take 1 tablet by mouth once daily 60 tablet 0   levothyroxine (SYNTHROID) 137 MCG tablet Take 137 mcg by mouth daily before breakfast.     nitroGLYCERIN (NITROSTAT) 0.4 MG SL tablet Place 1 tablet (0.4 mg total) under the tongue every 5 (five) minutes as needed for chest pain. 100 tablet 3    pantoprazole (PROTONIX) 40 MG tablet Take 1 tablet (40 mg total) by mouth daily. 90 tablet 1   rosuvastatin (CRESTOR) 40 MG tablet Take 40 mg by mouth daily.     traZODone (DESYREL) 100 MG tablet TAKE 1 TABLET BY MOUTH NIGHTLY AT BEDTIME AS NEEDED FOR SLEEP (NOTE  INCREASED  DOSAGE) 90 tablet 0   No current facility-administered medications for this visit.    Allergies:   Gabapentin    Social History:   reports that he has never smoked. He has never used smokeless tobacco. He reports that he does not currently use alcohol. He reports that he does not currently use drugs.   Family History:  family history is not on file.    ROS:     Review of Systems  Constitutional: Negative.   HENT: Negative.    Eyes: Negative.   Respiratory:  Positive for shortness of breath.   Gastrointestinal: Negative.   Genitourinary: Negative.   Musculoskeletal: Negative.   Skin: Negative.   Neurological: Negative.   Endo/Heme/Allergies: Negative.   Psychiatric/Behavioral: Negative.    All other systems reviewed and are negative.     All other systems are reviewed and negative.    PHYSICAL EXAM: VS:  BP (!) 170/90   Pulse 72   Ht 6\' 2"  (1.88 m)  Wt 214 lb 12.8 oz (97.4 kg)   SpO2 97%   BMI 27.58 kg/m  , BMI Body mass index is 27.58 kg/m. Last weight:  Wt Readings from Last 3 Encounters:  06/09/23 214 lb 12.8 oz (97.4 kg)  04/28/23 212 lb (96.2 kg)  04/07/23 205 lb 9.6 oz (93.3 kg)     Physical Exam Vitals reviewed.  Constitutional:      Appearance: Normal appearance. He is normal weight.  HENT:     Head: Normocephalic.     Nose: Nose normal.     Mouth/Throat:     Mouth: Mucous membranes are moist.  Eyes:     Pupils: Pupils are equal, round, and reactive to light.  Cardiovascular:     Rate and Rhythm: Normal rate and regular rhythm.     Pulses: Normal pulses.     Heart sounds: Normal heart sounds.  Pulmonary:     Effort: Pulmonary effort is normal.  Abdominal:     General:  Abdomen is flat. Bowel sounds are normal.  Musculoskeletal:        General: Normal range of motion.     Cervical back: Normal range of motion.  Skin:    General: Skin is warm.  Neurological:     General: No focal deficit present.     Mental Status: He is alert.  Psychiatric:        Mood and Affect: Mood normal.       EKG:   Recent Labs: 01/23/2023: TSH 5.020 03/20/2023: ALT 25 03/27/2023: BUN 21; Creatinine, Ser 0.93; Hemoglobin 15.4; Platelets 179; Potassium 3.6; Sodium 137    Lipid Panel    Component Value Date/Time   CHOL 92 (L) 01/23/2023 1115   TRIG 64 01/23/2023 1115   HDL 37 (L) 01/23/2023 1115   CHOLHDL 2.5 01/23/2023 1115   LDLCALC 41 01/23/2023 1115      Other studies Reviewed: Additional studies/ records that were reviewed today include:  Review of the above records demonstrates:       No data to display            ASSESSMENT AND PLAN:    ICD-10-CM   1. Coronary artery disease involving native coronary artery of native heart with other form of angina pectoris (HCC)  I25.118 losartan (COZAAR) 100 MG tablet    2. Coronary artery disease involving native coronary artery of native heart with angina pectoris (HCC)  I25.119 losartan (COZAAR) 100 MG tablet    3. Essential hypertension  I10 losartan (COZAAR) 100 MG tablet    4. Progressive angina (HCC)  I20.0 losartan (COZAAR) 100 MG tablet    5. 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 Hyperlipidemia, mixed  E78.2 losartan (COZAAR) 100 MG tablet    7. 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  Z95.5 losartan (COZAAR) 100 MG tablet       Problem List Items Addressed This Visit       Cardiovascular and Mediastinum   Essential hypertension (Chronic)   Relevant Medications   losartan (COZAAR) 100 MG tablet   Coronary artery disease involving native heart with angina pectoris (HCC) (Chronic)   Relevant  Medications   losartan (COZAAR) 100 MG tablet   Coronary artery disease - Primary   Relevant Medications   losartan (COZAAR) 100 MG tablet   Progressive angina (HCC)   Relevant Medications   losartan (COZAAR) 100 MG tablet  Other   Hyperlipidemia, mixed (Chronic)   Relevant Medications   losartan (COZAAR) 100 MG tablet   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    Total time spent: 30 minutes  Signed,  Adrian Blackwater, MD  06/09/2023 1:59 PM    [facility]

## 2023-06-10 ENCOUNTER — Other Ambulatory Visit: Payer: Self-pay

## 2023-06-10 DIAGNOSIS — I25119 Atherosclerotic heart disease of native coronary artery with unspecified angina pectoris: Secondary | ICD-10-CM

## 2023-06-10 DIAGNOSIS — I200 Unstable angina: Secondary | ICD-10-CM

## 2023-06-10 DIAGNOSIS — Z955 Presence of coronary angioplasty implant and graft: Secondary | ICD-10-CM

## 2023-06-10 DIAGNOSIS — I25118 Atherosclerotic heart disease of native coronary artery with other forms of angina pectoris: Secondary | ICD-10-CM

## 2023-06-10 DIAGNOSIS — T82855D Stenosis of coronary artery stent, subsequent encounter: Secondary | ICD-10-CM

## 2023-06-10 DIAGNOSIS — I10 Essential (primary) hypertension: Secondary | ICD-10-CM

## 2023-06-10 DIAGNOSIS — E782 Mixed hyperlipidemia: Secondary | ICD-10-CM

## 2023-06-12 ENCOUNTER — Ambulatory Visit: Payer: Managed Care, Other (non HMO) | Admitting: Cardiology

## 2023-06-15 ENCOUNTER — Encounter: Payer: Self-pay | Admitting: Cardiology

## 2023-06-15 ENCOUNTER — Ambulatory Visit (INDEPENDENT_AMBULATORY_CARE_PROVIDER_SITE_OTHER): Payer: Managed Care, Other (non HMO) | Admitting: Cardiology

## 2023-06-15 VITALS — BP 150/80 | HR 66 | Ht 74.0 in | Wt 214.4 lb

## 2023-06-15 DIAGNOSIS — I25119 Atherosclerotic heart disease of native coronary artery with unspecified angina pectoris: Secondary | ICD-10-CM

## 2023-06-15 DIAGNOSIS — E039 Hypothyroidism, unspecified: Secondary | ICD-10-CM | POA: Diagnosis not present

## 2023-06-15 DIAGNOSIS — E782 Mixed hyperlipidemia: Secondary | ICD-10-CM | POA: Diagnosis not present

## 2023-06-15 DIAGNOSIS — I10 Essential (primary) hypertension: Secondary | ICD-10-CM | POA: Diagnosis not present

## 2023-06-15 NOTE — Progress Notes (Signed)
Established Patient Office Visit  Subjective:  Patient ID: Jesse May, male    DOB: 09-04-1956  Age: 67 y.o. MRN: [id number]  Chief Complaint  Patient presents with   Follow-up    4 month follow up    Patient in office for 4 month follow up. Patient doing well, no complaints today. Patient not fasting today. Will return another day for fasting blood work.     No other concerns at this time.   Past Medical History:  Diagnosis Date   MI (myocardial infarction) [facility])     Past Surgical History:  Procedure Laterality Date   APPENDECTOMY     CARDIAC CATHETERIZATION  2015   stent left circumflex-per Dr. Leonard Schwartz. Gwen Pounds note Care Everywhere   CHOLECYSTECTOMY     CORONARY BALLOON ANGIOPLASTY N/A 03/26/2023   Procedure: CORONARY BALLOON ANGIOPLASTY;  Surgeon: Marykay Lex, MD;  Location: [facility] INVASIVE CV LAB;  Service: Cardiovascular;  Laterality: N/A;   LEFT HEART CATH AND CORONARY ANGIOGRAPHY Left 03/26/2023   Procedure: LEFT HEART CATH AND CORONARY ANGIOGRAPHY;  Surgeon: Laurier Nancy, MD;  Location: [facility] INVASIVE CV LAB;  Service: Cardiovascular;  Laterality: Left;   TONSILLECTOMY      Social History   Socioeconomic History   Marital status: Divorced    Spouse name: Not on file   Number of children: Not on file   Years of education: Not on file   Highest education level: Not on file  Occupational History   Not on file  Tobacco Use   Smoking status: Never   Smokeless tobacco: Never  Vaping Use   Vaping status: Never Used  Substance and Sexual Activity   Alcohol use: Not Currently   Drug use: Not Currently   Sexual activity: Not on file  Other Topics Concern   Not on file  Social History Narrative   Lives alone.  Jesse May, here today. Has pet, dog.    Social Determinants of Health   Financial Resource Strain: Not on file  Food Insecurity: Not on file  Transportation Needs: Not on file  Physical Activity: Not on file  Stress: Not on file  Social  Connections: Not on file  Intimate Partner Violence: Not on file    No family history on file.  No Active Allergies   Review of Systems  Constitutional: Negative.   HENT: Negative.    Eyes: Negative.   Respiratory: Negative.  Negative for shortness of breath.   Cardiovascular: Negative.  Negative for chest pain.  Gastrointestinal: Negative.  Negative for abdominal pain, constipation and diarrhea.  Genitourinary: Negative.   Musculoskeletal:  Negative for joint pain and myalgias.  Skin: Negative.   Neurological: Negative.  Negative for dizziness and headaches.  Endo/Heme/Allergies: Negative.   All other systems reviewed and are negative.      Objective:   BP (!) 150/80   Pulse 66   Ht 6\' 2"  (1.88 m)   Wt 214 lb 6.4 oz (97.3 kg)   SpO2 95%   BMI 27.53 kg/m   Vitals:   06/15/23 1325  BP: (!) 150/80  Pulse: 66  Height: 6\' 2"  (1.88 m)  Weight: 214 lb 6.4 oz (97.3 kg)  SpO2: 95%  BMI (Calculated): 27.52    Physical Exam Nursing note reviewed.  Constitutional:      Appearance: Normal appearance. He is normal weight.  HENT:     Head: Normocephalic and atraumatic.     Nose: Nose normal.     Mouth/Throat:  Mouth: Mucous membranes are moist.     Pharynx: Oropharynx is clear.  Eyes:     Extraocular Movements: Extraocular movements intact.     Conjunctiva/sclera: Conjunctivae normal.     Pupils: Pupils are equal, round, and reactive to light.  Cardiovascular:     Rate and Rhythm: Normal rate and regular rhythm.     Pulses: Normal pulses.     Heart sounds: Normal heart sounds.  Pulmonary:     Effort: Pulmonary effort is normal.     Breath sounds: Normal breath sounds.  Abdominal:     General: Abdomen is flat. Bowel sounds are normal.     Palpations: Abdomen is soft.  Musculoskeletal:        General: Normal range of motion.     Cervical back: Normal range of motion.  Skin:    General: Skin is warm and dry.  Neurological:     General: No focal deficit  present.     Mental Status: He is alert and oriented to person, place, and time.  Psychiatric:        Mood and Affect: Mood normal.        Behavior: Behavior normal.        Thought Content: Thought content normal.        Judgment: Judgment normal.      No results found for any visits on 06/15/23.  Recent Results (from the past 2160 hour(s))  CBC With Diff/Platelet     Status: None   Collection Time: 03/20/23  1:28 PM  Result Value Ref Range   WBC 5.7 3.4 - 10.8 x10E3/uL   RBC 4.96 4.14 - 5.80 x10E6/uL   Hemoglobin 14.7 13.0 - 17.7 g/dL   Hematocrit 44.0 10.2 - 51.0 %   MCV 88 79 - 97 fL   MCH 29.6 26.6 - 33.0 pg   MCHC 33.6 31.5 - 35.7 g/dL   RDW 72.5 36.6 - 44.0 %   Platelets 168 150 - 450 x10E3/uL   Neutrophils 72 Not Estab. %   Lymphs 17 Not Estab. %   Monocytes 7 Not Estab. %   Eos 3 Not Estab. %   Basos 1 Not Estab. %   Neutrophils Absolute 4.1 1.4 - 7.0 x10E3/uL   Lymphocytes Absolute 1.0 0.7 - 3.1 x10E3/uL   Monocytes Absolute 0.4 0.1 - 0.9 x10E3/uL   EOS (ABSOLUTE) 0.2 0.0 - 0.4 x10E3/uL   Basophils Absolute 0.0 0.0 - 0.2 x10E3/uL   Immature Granulocytes 0 Not Estab. %   Immature Grans (Abs) 0.0 0.0 - 0.1 x10E3/uL  Comprehensive metabolic panel     Status: Abnormal   Collection Time: 03/20/23  1:40 PM  Result Value Ref Range   Glucose 80 70 - 99 mg/dL   BUN 15 8 - 27 mg/dL   Creatinine, Ser 3.47 0.76 - 1.27 mg/dL   eGFR 83 >42 VZ/DGL/8.75   BUN/Creatinine Ratio 15 10 - 24   Sodium 138 134 - 144 mmol/L   Potassium 4.2 3.5 - 5.2 mmol/L   Chloride 99 96 - 106 mmol/L   CO2 25 20 - 29 mmol/L   Calcium 9.2 8.6 - 10.2 mg/dL   Total Protein 6.2 6.0 - 8.5 g/dL   Albumin 4.4 3.9 - 4.9 g/dL   Globulin, Total 1.8 1.5 - 4.5 g/dL   Albumin/Globulin Ratio 2.4 (H) 1.2 - 2.2   Bilirubin Total 1.3 (H) 0.0 - 1.2 mg/dL   Alkaline Phosphatase 82 44 - 121 IU/L   AST 16 0 -  40 IU/L   ALT 25 0 - 44 IU/L  POCT Activated clotting time     Status: None   Collection Time:  03/26/23  2:13 PM  Result Value Ref Range   Activated Clotting Time 513 seconds    Comment: Reference range 74-137 seconds for patients not on anticoagulant therapy.  POCT Activated clotting time     Status: None   Collection Time: 03/26/23  2:22 PM  Result Value Ref Range   Activated Clotting Time 324 seconds    Comment: Reference range 74-137 seconds for patients not on anticoagulant therapy.  Basic metabolic panel     Status: Abnormal   Collection Time: 03/26/23  7:04 PM  Result Value Ref Range   Sodium 138 135 - 145 mmol/L   Potassium 3.5 3.5 - 5.1 mmol/L   Chloride 103 98 - 111 mmol/L   CO2 27 22 - 32 mmol/L   Glucose, Bld 90 70 - 99 mg/dL    Comment: Glucose reference range applies only to samples taken after fasting for at least 8 hours.   BUN 16 8 - 23 mg/dL   Creatinine, Ser 7.82 0.61 - 1.24 mg/dL   Calcium 8.8 (L) 8.9 - 10.3 mg/dL   GFR, Estimated >95 >62 mL/min    Comment: (NOTE) Calculated using the CKD-EPI Creatinine Equation (2021)    Anion gap 8 5 - 15    Comment: Performed at [facility], [address] [place], [place] [address]  CBC     Status: Abnormal   Collection Time: 03/26/23  7:04 PM  Result Value Ref Range   WBC 7.7 4.0 - 10.5 K/uL   RBC 5.85 (H) 4.22 - 5.81 MIL/uL   Hemoglobin 16.9 13.0 - 17.0 g/dL   HCT 57.8 46.9 - 62.9 %   MCV 85.5 80.0 - 100.0 fL   MCH 28.9 26.0 - 34.0 pg   MCHC 33.8 30.0 - 36.0 g/dL   RDW 52.8 41.3 - 24.4 %   Platelets 183 150 - 400 K/uL   nRBC 0.0 0.0 - 0.2 %    Comment: Performed at [facility], [address] [place], [place] [address]  HIV Antibody (routine testing w rflx)     Status: None   Collection Time: 03/26/23  7:04 PM  Result Value Ref Range   HIV Screen 4th Generation wRfx Non Reactive Non Reactive    Comment: Performed at [facility], [address], [place], [place] [address]  Lipoprotein A (LPA)     Status: Abnormal   Collection Time: 03/27/23  5:36 AM  Result Value Ref Range    Lipoprotein (a) 78.8 (H) <75.0 nmol/L    Comment: (NOTE) Note:  Values greater than or equal to 75.0 nmol/L may       indicate an independent risk factor for CHD,       but must be evaluated with caution when applied       to non-Caucasian populations due to the       influence of genetic factors on Lp(a) across       ethnicities. Performed At: [facility] [address] [place], [place] [id number] Jolene Schimke MD ZD:[id number]   Basic metabolic panel     Status: Abnormal   Collection Time: 03/27/23  5:36 AM  Result Value Ref Range   Sodium 137 135 - 145 mmol/L   Potassium 3.6 3.5 - 5.1 mmol/L   Chloride 105 98 - 111 mmol/L   CO2 25  22 - 32 mmol/L   Glucose, Bld 118 (H) 70 - 99 mg/dL    Comment: Glucose reference range applies only to samples taken after fasting for at least 8 hours.   BUN 21 8 - 23 mg/dL   Creatinine, Ser 1.61 0.61 - 1.24 mg/dL   Calcium 8.7 (L) 8.9 - 10.3 mg/dL   GFR, Estimated >09 >60 mL/min    Comment: (NOTE) Calculated using the CKD-EPI Creatinine Equation (2021)    Anion gap 7 5 - 15    Comment: Performed at [facility], [address] [place], [place] [address]  CBC     Status: None   Collection Time: 03/27/23  5:36 AM  Result Value Ref Range   WBC 6.2 4.0 - 10.5 K/uL   RBC 5.26 4.22 - 5.81 MIL/uL   Hemoglobin 15.4 13.0 - 17.0 g/dL   HCT 81.1 91.4 - 78.2 %   MCV 86.5 80.0 - 100.0 fL   MCH 29.3 26.0 - 34.0 pg   MCHC 33.8 30.0 - 36.0 g/dL   RDW 95.6 21.3 - 08.6 %   Platelets 179 150 - 400 K/uL   nRBC 0.0 0.0 - 0.2 %    Comment: Performed at [facility], [address] [place], [place] [address]      Assessment & Plan:  Return for fasting blood work. Continue medications.   Problem List Items Addressed This Visit       Cardiovascular and Mediastinum   Essential hypertension - Primary (Chronic)   Relevant Orders   CMP14+EGFR   Coronary artery disease involving native heart with angina pectoris (HCC)  (Chronic)   Relevant Orders   Hemoglobin A1c     Other   Hyperlipidemia, mixed (Chronic)   Relevant Orders   Lipid Profile   Other Visit Diagnoses     Primary hypothyroidism       Relevant Orders   TSH       No follow-ups on file.   Total time spent: 25 minutes  [employer], NP  06/15/2023   This document may have been prepared by Dragon Voice Recognition software and as such may include unintentional dictation errors.

## 2023-06-23 ENCOUNTER — Ambulatory Visit (INDEPENDENT_AMBULATORY_CARE_PROVIDER_SITE_OTHER): Payer: Managed Care, Other (non HMO) | Admitting: Cardiovascular Disease

## 2023-06-23 ENCOUNTER — Other Ambulatory Visit: Payer: Self-pay

## 2023-06-23 ENCOUNTER — Encounter: Payer: Self-pay | Admitting: Cardiovascular Disease

## 2023-06-23 VITALS — BP 142/71 | HR 60 | Ht 74.0 in | Wt 216.4 lb

## 2023-06-23 DIAGNOSIS — I25119 Atherosclerotic heart disease of native coronary artery with unspecified angina pectoris: Secondary | ICD-10-CM | POA: Diagnosis not present

## 2023-06-23 DIAGNOSIS — I25118 Atherosclerotic heart disease of native coronary artery with other forms of angina pectoris: Secondary | ICD-10-CM

## 2023-06-23 DIAGNOSIS — I10 Essential (primary) hypertension: Secondary | ICD-10-CM

## 2023-06-23 DIAGNOSIS — Z955 Presence of coronary angioplasty implant and graft: Secondary | ICD-10-CM

## 2023-06-23 DIAGNOSIS — G4733 Obstructive sleep apnea (adult) (pediatric): Secondary | ICD-10-CM | POA: Diagnosis not present

## 2023-06-23 DIAGNOSIS — I209 Angina pectoris, unspecified: Secondary | ICD-10-CM

## 2023-06-23 MED ORDER — METOPROLOL SUCCINATE ER 25 MG PO TB24
25.0000 mg | ORAL_TABLET | Freq: Every day | ORAL | 11 refills | Status: DC
Start: 2023-06-23 — End: 2023-09-22

## 2023-06-23 MED ORDER — LEVOTHYROXINE SODIUM 137 MCG PO TABS: 137.0000 ug | ORAL_TABLET | Freq: Every day | ORAL | 2 refills | Status: DC

## 2023-06-23 NOTE — Progress Notes (Signed)
Cardiology Office Note   Date:  06/23/2023   ID:  SAHIR CEPIN, DOB Jan 06, 1956, MRN [id number]  PCP:  Orson Eva, NP (Inactive)  Cardiologist:  Adrian Blackwater, MD      History of Present Illness: Jesse May is a 67 y.o. male who presents for  Chief Complaint  Patient presents with   Follow-up    Shortness of Breath This is a chronic problem. The problem has been unchanged.      Past Medical History:  Diagnosis Date   MI (myocardial infarction) [facility])      Past Surgical History:  Procedure Laterality Date   APPENDECTOMY     CARDIAC CATHETERIZATION  2015   stent left circumflex-per Dr. Leonard Schwartz. Gwen Pounds note Care Everywhere   CHOLECYSTECTOMY     CORONARY BALLOON ANGIOPLASTY N/A 03/26/2023   Procedure: CORONARY BALLOON ANGIOPLASTY;  Surgeon: Marykay Lex, MD;  Location: [facility] INVASIVE CV LAB;  Service: Cardiovascular;  Laterality: N/A;   LEFT HEART CATH AND CORONARY ANGIOGRAPHY Left 03/26/2023   Procedure: LEFT HEART CATH AND CORONARY ANGIOGRAPHY;  Surgeon: Laurier Nancy, MD;  Location: [facility] INVASIVE CV LAB;  Service: Cardiovascular;  Laterality: Left;   TONSILLECTOMY       Current Outpatient Medications  Medication Sig Dispense Refill   metoprolol succinate (TOPROL XL) 25 MG 24 hr tablet Take 1 tablet (25 mg total) by mouth daily. 30 tablet 11   Aspirin 81 MG CAPS Aspirin 81 MG Oral Capsule QTY: 0 capsule Days: 0 Refills: 0  Written: 02/07/21 Patient Instructions:     clopidogrel (PLAVIX) 75 MG tablet Take 1 tablet (75 mg total) by mouth daily. 30 tablet 11   isosorbide mononitrate (IMDUR) 30 MG 24 hr tablet Take 1 tablet by mouth once daily 60 tablet 0   levothyroxine (SYNTHROID) 137 MCG tablet Take 137 mcg by mouth daily before breakfast.     losartan (COZAAR) 100 MG tablet Take 1 tablet (100 mg total) by mouth daily. 30 tablet 11   nitroGLYCERIN (NITROSTAT) 0.4 MG SL tablet Place 1 tablet (0.4 mg total) under the tongue every 5 (five) minutes as needed for chest  pain. (Patient not taking: Reported on 06/15/2023) 100 tablet 3   pantoprazole (PROTONIX) 40 MG tablet Take 1 tablet (40 mg total) by mouth daily. 90 tablet 1   rosuvastatin (CRESTOR) 40 MG tablet Take 40 mg by mouth daily.     traZODone (DESYREL) 100 MG tablet TAKE 1 TABLET BY MOUTH NIGHTLY AT BEDTIME AS NEEDED FOR SLEEP (NOTE  INCREASED  DOSAGE) 90 tablet 0   No current facility-administered medications for this visit.    Allergies:   Patient has no active allergies.    Social History:   reports that he has never smoked. He has never used smokeless tobacco. He reports that he does not currently use alcohol. He reports that he does not currently use drugs.   Family History:  family history is not on file.    ROS:     Review of Systems  Constitutional: Negative.   HENT: Negative.    Eyes: Negative.   Respiratory:  Positive for shortness of breath.   Gastrointestinal: Negative.   Genitourinary: Negative.   Musculoskeletal: Negative.   Skin: Negative.   Neurological: Negative.   Endo/Heme/Allergies: Negative.   Psychiatric/Behavioral: Negative.    All other systems reviewed and are negative.     All other systems are reviewed and negative.    PHYSICAL EXAM: VS:  BP (!) 142/71  Pulse 60   Ht 6\' 2"  (1.88 m)   Wt 216 lb 6.4 oz (98.2 kg)   SpO2 98%   BMI 27.78 kg/m  , BMI Body mass index is 27.78 kg/m. Last weight:  Wt Readings from Last 3 Encounters:  06/23/23 216 lb 6.4 oz (98.2 kg)  06/15/23 214 lb 6.4 oz (97.3 kg)  06/09/23 214 lb 12.8 oz (97.4 kg)     Physical Exam Vitals reviewed.  Constitutional:      Appearance: Normal appearance. He is normal weight.  HENT:     Head: Normocephalic.     Nose: Nose normal.     Mouth/Throat:     Mouth: Mucous membranes are moist.  Eyes:     Pupils: Pupils are equal, round, and reactive to light.  Cardiovascular:     Rate and Rhythm: Normal rate and regular rhythm.     Pulses: Normal pulses.     Heart sounds: Normal  heart sounds.  Pulmonary:     Effort: Pulmonary effort is normal.  Abdominal:     General: Abdomen is flat. Bowel sounds are normal.  Musculoskeletal:        General: Normal range of motion.     Cervical back: Normal range of motion.  Skin:    General: Skin is warm.  Neurological:     General: No focal deficit present.     Mental Status: He is alert.  Psychiatric:        Mood and Affect: Mood normal.       EKG:   Recent Labs: 01/23/2023: TSH 5.020 03/20/2023: ALT 25 03/27/2023: BUN 21; Creatinine, Ser 0.93; Hemoglobin 15.4; Platelets 179; Potassium 3.6; Sodium 137    Lipid Panel    Component Value Date/Time   CHOL 92 (L) 01/23/2023 1115   TRIG 64 01/23/2023 1115   HDL 37 (L) 01/23/2023 1115   CHOLHDL 2.5 01/23/2023 1115   LDLCALC 41 01/23/2023 1115      Other studies Reviewed: Additional studies/ records that were reviewed today include:  Review of the above records demonstrates:       No data to display            ASSESSMENT AND PLAN:    ICD-10-CM   1. Coronary artery disease involving native coronary artery of native heart with other form of angina pectoris (HCC)  I25.118 metoprolol succinate (TOPROL XL) 25 MG 24 hr tablet    2. Coronary artery disease involving native coronary artery of native heart with angina pectoris (HCC)  I25.119 metoprolol succinate (TOPROL XL) 25 MG 24 hr tablet    3. Essential hypertension  I10 metoprolol succinate (TOPROL XL) 25 MG 24 hr tablet   Add metoprolol, as systolic up    4. Angina, class III (HCC)  I20.9 metoprolol succinate (TOPROL XL) 25 MG 24 hr tablet    5. Obstructive sleep apnea  G47.33 metoprolol succinate (TOPROL XL) 25 MG 24 hr tablet    6. 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  Z95.5 metoprolol succinate (TOPROL XL) 25 MG 24 hr tablet       Problem List Items Addressed This Visit       Cardiovascular and Mediastinum   Essential hypertension (Chronic)   Relevant Medications    metoprolol succinate (TOPROL XL) 25 MG 24 hr tablet   Coronary artery disease involving native heart with angina pectoris (HCC) (Chronic)   Relevant Medications   metoprolol succinate (TOPROL XL) 25 MG 24 hr tablet  Angina, class III (HCC)   Relevant Medications   metoprolol succinate (TOPROL XL) 25 MG 24 hr tablet   Coronary artery disease - Primary   Relevant Medications   metoprolol succinate (TOPROL XL) 25 MG 24 hr tablet     Respiratory   Obstructive sleep apnea   Relevant Medications   metoprolol succinate (TOPROL XL) 25 MG 24 hr tablet     Other   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    Total time spent: 35 minutes  Signed,  Adrian Blackwater, MD  06/23/2023 1:23 PM    [facility]

## 2023-06-23 NOTE — Telephone Encounter (Signed)
Left message about xiaflex . Medication still not approved

## 2023-06-24 ENCOUNTER — Other Ambulatory Visit: Payer: Managed Care, Other (non HMO)

## 2023-06-24 ENCOUNTER — Other Ambulatory Visit: Payer: Self-pay

## 2023-06-24 DIAGNOSIS — I25119 Atherosclerotic heart disease of native coronary artery with unspecified angina pectoris: Secondary | ICD-10-CM

## 2023-06-24 DIAGNOSIS — E782 Mixed hyperlipidemia: Secondary | ICD-10-CM

## 2023-06-24 DIAGNOSIS — E039 Hypothyroidism, unspecified: Secondary | ICD-10-CM

## 2023-06-24 DIAGNOSIS — I10 Essential (primary) hypertension: Secondary | ICD-10-CM

## 2023-06-25 LAB — LIPID PANEL
Chol/HDL Ratio: 2.1 ratio (ref 0.0–5.0)
Cholesterol, Total: 79 mg/dL — ABNORMAL LOW (ref 100–199)
HDL: 37 mg/dL — ABNORMAL LOW (ref >39)
LDL Chol Calc (NIH): 29 mg/dL (ref 0–99)
Triglycerides: 50 mg/dL (ref 0–149)
VLDL Cholesterol Cal: 13 mg/dL (ref 5–40)

## 2023-06-25 LAB — CMP14+EGFR
ALT: 17 IU/L (ref 0–44)
AST: 19 IU/L (ref 0–40)
Albumin: 4.1 g/dL (ref 3.9–4.9)
Alkaline Phosphatase: 52 IU/L (ref 44–121)
BUN/Creatinine Ratio: 11 (ref 10–24)
BUN: 13 mg/dL (ref 8–27)
Bilirubin Total: 0.9 mg/dL (ref 0.0–1.2)
CO2: 22 mmol/L (ref 20–29)
Calcium: 8.6 mg/dL (ref 8.6–10.2)
Chloride: 104 mmol/L (ref 96–106)
Creatinine, Ser: 1.22 mg/dL (ref 0.76–1.27)
Globulin, Total: 1.6 g/dL (ref 1.5–4.5)
Glucose: 94 mg/dL (ref 70–99)
Potassium: 4.3 mmol/L (ref 3.5–5.2)
Sodium: 138 mmol/L (ref 134–144)
Total Protein: 5.7 g/dL — ABNORMAL LOW (ref 6.0–8.5)
eGFR: 64 mL/min/{1.73_m2} (ref >59)

## 2023-06-25 LAB — TSH: TSH: 2.42 u[IU]/mL (ref 0.450–4.500)

## 2023-06-25 LAB — HEMOGLOBIN A1C
Est. average glucose Bld gHb Est-mCnc: 108 mg/dL
Hgb A1c MFr Bld: 5.4 % (ref 4.8–5.6)

## 2023-06-25 NOTE — Progress Notes (Signed)
Spoke with pt who verbalized understanding.

## 2023-07-14 ENCOUNTER — Other Ambulatory Visit: Payer: Self-pay | Admitting: Cardiovascular Disease

## 2023-07-14 DIAGNOSIS — R0789 Other chest pain: Secondary | ICD-10-CM

## 2023-07-20 ENCOUNTER — Other Ambulatory Visit: Payer: Self-pay | Admitting: Cardiology

## 2023-07-27 ENCOUNTER — Other Ambulatory Visit: Payer: Self-pay | Admitting: Cardiology

## 2023-07-28 ENCOUNTER — Other Ambulatory Visit: Payer: Self-pay | Admitting: Cardiology

## 2023-07-31 ENCOUNTER — Other Ambulatory Visit: Payer: Self-pay

## 2023-07-31 DIAGNOSIS — K219 Gastro-esophageal reflux disease without esophagitis: Secondary | ICD-10-CM

## 2023-07-31 MED ORDER — PANTOPRAZOLE SODIUM 40 MG PO TBEC
40.0000 mg | DELAYED_RELEASE_TABLET | Freq: Every day | ORAL | 1 refills | Status: DC
Start: 2023-07-31 — End: 2023-10-06

## 2023-08-04 ENCOUNTER — Other Ambulatory Visit: Payer: Self-pay

## 2023-08-04 ENCOUNTER — Telehealth: Payer: Self-pay | Admitting: Nurse Practitioner

## 2023-08-04 NOTE — Telephone Encounter (Signed)
Entered in error

## 2023-08-11 ENCOUNTER — Other Ambulatory Visit: Payer: Self-pay

## 2023-09-22 ENCOUNTER — Encounter: Payer: Self-pay | Admitting: Cardiovascular Disease

## 2023-09-22 ENCOUNTER — Ambulatory Visit (INDEPENDENT_AMBULATORY_CARE_PROVIDER_SITE_OTHER): Payer: Managed Care, Other (non HMO) | Admitting: Cardiovascular Disease

## 2023-09-22 VITALS — BP 168/96 | HR 67 | Ht 74.0 in | Wt 219.4 lb

## 2023-09-22 DIAGNOSIS — I25118 Atherosclerotic heart disease of native coronary artery with other forms of angina pectoris: Secondary | ICD-10-CM | POA: Diagnosis not present

## 2023-09-22 DIAGNOSIS — R0602 Shortness of breath: Secondary | ICD-10-CM

## 2023-09-22 DIAGNOSIS — I10 Essential (primary) hypertension: Secondary | ICD-10-CM

## 2023-09-22 DIAGNOSIS — I209 Angina pectoris, unspecified: Secondary | ICD-10-CM

## 2023-09-22 DIAGNOSIS — E782 Mixed hyperlipidemia: Secondary | ICD-10-CM | POA: Diagnosis not present

## 2023-09-22 DIAGNOSIS — I25119 Atherosclerotic heart disease of native coronary artery with unspecified angina pectoris: Secondary | ICD-10-CM

## 2023-09-22 MED ORDER — POTASSIUM CHLORIDE CRYS ER 10 MEQ PO TBCR: 10.0000 meq | EXTENDED_RELEASE_TABLET | Freq: Two times a day (BID) | ORAL | 1 refills | Status: DC

## 2023-09-22 MED ORDER — CHLORTHALIDONE 25 MG PO TABS: 25.0000 mg | ORAL_TABLET | Freq: Every day | ORAL | 1 refills | Status: DC

## 2023-09-22 NOTE — Progress Notes (Signed)
Cardiology Office Note   Date:  09/22/2023   ID:  DANTAE LEISHER, DOB 1956-02-24, MRN [id number]  PCP:  Orson Eva, NP  Cardiologist:  Adrian Blackwater, MD      History of Present Illness: Jesse May is a 67 y.o. male who presents for  Chief Complaint  Patient presents with   Follow-up    3 month follow up    Has SOB, gasping for air  Shortness of Breath This is a recurrent problem. The current episode started 1 to 4 weeks ago. The problem has been gradually worsening. Pertinent negatives include no chest pain, orthopnea or PND.      Past Medical History:  Diagnosis Date   MI (myocardial infarction) [facility])      Past Surgical History:  Procedure Laterality Date   APPENDECTOMY     CARDIAC CATHETERIZATION  2015   stent left circumflex-per Dr. Leonard Schwartz. Gwen Pounds note Care Everywhere   CHOLECYSTECTOMY     CORONARY BALLOON ANGIOPLASTY N/A 03/26/2023   Procedure: CORONARY BALLOON ANGIOPLASTY;  Surgeon: Marykay Lex, MD;  Location: [facility] INVASIVE CV LAB;  Service: Cardiovascular;  Laterality: N/A;   LEFT HEART CATH AND CORONARY ANGIOGRAPHY Left 03/26/2023   Procedure: LEFT HEART CATH AND CORONARY ANGIOGRAPHY;  Surgeon: Laurier Nancy, MD;  Location: [facility] INVASIVE CV LAB;  Service: Cardiovascular;  Laterality: Left;   TONSILLECTOMY       Current Outpatient Medications  Medication Sig Dispense Refill   Aspirin 81 MG CAPS Aspirin 81 MG Oral Capsule QTY: 0 capsule Days: 0 Refills: 0  Written: 02/07/21 Patient Instructions:     chlorthalidone (HYGROTON) 25 MG tablet Take 1 tablet (25 mg total) by mouth daily. 30 tablet 1   clopidogrel (PLAVIX) 75 MG tablet Take 1 tablet (75 mg total) by mouth daily. 30 tablet 11   isosorbide mononitrate (IMDUR) 30 MG 24 hr tablet Take 1 tablet by mouth once daily 60 tablet 0   levothyroxine (SYNTHROID) 137 MCG tablet Take 1 tablet (137 mcg total) by mouth daily before breakfast. 90 tablet 2   losartan (COZAAR) 100 MG tablet Take 1 tablet (100 mg  total) by mouth daily. 30 tablet 11   pantoprazole (PROTONIX) 40 MG tablet Take 1 tablet (40 mg total) by mouth daily. 90 tablet 1   potassium chloride (KLOR-CON M) 10 MEQ tablet Take 1 tablet (10 mEq total) by mouth 2 (two) times daily. 30 tablet 1   rosuvastatin (CRESTOR) 40 MG tablet Take 40 mg by mouth daily.     traZODone (DESYREL) 100 MG tablet TAKE 1 TABLET BY MOUTH NIGHTLY AT BEDTIME AS NEEDED FOR SLEEP (NOTE  INCREASED  DOSAGE) 90 tablet 0   nitroGLYCERIN (NITROSTAT) 0.4 MG SL tablet Place 1 tablet (0.4 mg total) under the tongue every 5 (five) minutes as needed for chest pain. (Patient not taking: Reported on 06/15/2023) 100 tablet 3   No current facility-administered medications for this visit.    Allergies:   Patient has no known allergies.    Social History:   reports that he has never smoked. He has never used smokeless tobacco. He reports that he does not currently use alcohol. He reports that he does not currently use drugs.   Family History:  family history is not on file.    ROS:     Review of Systems  Constitutional: Negative.   HENT: Negative.    Eyes: Negative.   Respiratory:  Positive for shortness of breath.   Cardiovascular:  Negative for chest pain, orthopnea and PND.  Gastrointestinal: Negative.   Genitourinary: Negative.   Musculoskeletal: Negative.   Skin: Negative.   Neurological: Negative.   Endo/Heme/Allergies: Negative.   Psychiatric/Behavioral: Negative.    All other systems reviewed and are negative.     All other systems are reviewed and negative.    PHYSICAL EXAM: VS:  BP (!) 168/96   Pulse 67   Ht 6\' 2"  (1.88 m)   Wt 219 lb 6.4 oz (99.5 kg)   SpO2 96%   BMI 28.17 kg/m  , BMI Body mass index is 28.17 kg/m. Last weight:  Wt Readings from Last 3 Encounters:  09/22/23 219 lb 6.4 oz (99.5 kg)  06/23/23 216 lb 6.4 oz (98.2 kg)  06/15/23 214 lb 6.4 oz (97.3 kg)     Physical Exam Vitals reviewed.  Constitutional:      Appearance:  Normal appearance. He is normal weight.  HENT:     Head: Normocephalic.     Nose: Nose normal.     Mouth/Throat:     Mouth: Mucous membranes are moist.  Eyes:     Pupils: Pupils are equal, round, and reactive to light.  Cardiovascular:     Rate and Rhythm: Normal rate and regular rhythm.     Pulses: Normal pulses.     Heart sounds: Normal heart sounds.  Pulmonary:     Effort: Pulmonary effort is normal.  Abdominal:     General: Abdomen is flat. Bowel sounds are normal.  Musculoskeletal:        General: Normal range of motion.     Cervical back: Normal range of motion.  Skin:    General: Skin is warm.  Neurological:     General: No focal deficit present.     Mental Status: He is alert.  Psychiatric:        Mood and Affect: Mood normal.       EKG:   Recent Labs: 03/27/2023: Hemoglobin 15.4; Platelets 179 06/24/2023: ALT 17; BUN 13; Creatinine, Ser 1.22; Potassium 4.3; Sodium 138; TSH 2.420    Lipid Panel    Component Value Date/Time   CHOL 79 (L) 06/24/2023 0955   TRIG 50 06/24/2023 0955   HDL 37 (L) 06/24/2023 0955   CHOLHDL 2.1 06/24/2023 0955   LDLCALC 29 06/24/2023 0955      Other studies Reviewed: Additional studies/ records that were reviewed today include:  Review of the above records demonstrates:       No data to display            ASSESSMENT AND PLAN:    ICD-10-CM   1. Coronary artery disease involving native coronary artery of native heart with other form of angina pectoris (HCC)  I25.118 PCV ECHOCARDIOGRAM COMPLETE    MYOCARDIAL PERFUSION IMAGING    chlorthalidone (HYGROTON) 25 MG tablet    potassium chloride (KLOR-CON M) 10 MEQ tablet    2. Essential hypertension  I10 PCV ECHOCARDIOGRAM COMPLETE    MYOCARDIAL PERFUSION IMAGING    chlorthalidone (HYGROTON) 25 MG tablet    potassium chloride (KLOR-CON M) 10 MEQ tablet   Add chlorthalidone 25 and Kdur 10 meq as BP high    3. Coronary artery disease involving native coronary artery of  native heart with angina pectoris (HCC)  I25.119 PCV ECHOCARDIOGRAM COMPLETE    MYOCARDIAL PERFUSION IMAGING    chlorthalidone (HYGROTON) 25 MG tablet    potassium chloride (KLOR-CON M) 10 MEQ tablet    4. Angina, class III (  HCC)  I20.9 PCV ECHOCARDIOGRAM COMPLETE    MYOCARDIAL PERFUSION IMAGING    chlorthalidone (HYGROTON) 25 MG tablet    potassium chloride (KLOR-CON M) 10 MEQ tablet   Has to stop to get air as gets SOB, at times gasping    5. Hyperlipidemia, mixed  E78.2 PCV ECHOCARDIOGRAM COMPLETE    MYOCARDIAL PERFUSION IMAGING    chlorthalidone (HYGROTON) 25 MG tablet    potassium chloride (KLOR-CON M) 10 MEQ tablet    6. SOB (shortness of breath)  R06.02 PCV ECHOCARDIOGRAM COMPLETE    MYOCARDIAL PERFUSION IMAGING    chlorthalidone (HYGROTON) 25 MG tablet    potassium chloride (KLOR-CON M) 10 MEQ tablet   has SOB getting worse, advise echo, stress test       Problem List Items Addressed This Visit       Cardiovascular and Mediastinum   Essential hypertension (Chronic)   Relevant Medications   chlorthalidone (HYGROTON) 25 MG tablet   potassium chloride (KLOR-CON M) 10 MEQ tablet   Other Relevant Orders   PCV ECHOCARDIOGRAM COMPLETE   MYOCARDIAL PERFUSION IMAGING   Coronary artery disease involving native heart with angina pectoris (HCC) (Chronic)   Relevant Medications   chlorthalidone (HYGROTON) 25 MG tablet   potassium chloride (KLOR-CON M) 10 MEQ tablet   Other Relevant Orders   PCV ECHOCARDIOGRAM COMPLETE   MYOCARDIAL PERFUSION IMAGING   Angina, class III (HCC)   Relevant Medications   chlorthalidone (HYGROTON) 25 MG tablet   potassium chloride (KLOR-CON M) 10 MEQ tablet   Other Relevant Orders   PCV ECHOCARDIOGRAM COMPLETE   MYOCARDIAL PERFUSION IMAGING   Coronary artery disease - Primary   Relevant Medications   chlorthalidone (HYGROTON) 25 MG tablet   potassium chloride (KLOR-CON M) 10 MEQ tablet   Other Relevant Orders   PCV ECHOCARDIOGRAM COMPLETE    MYOCARDIAL PERFUSION IMAGING   PCV ECHOCARDIOGRAM COMPLETE   MYOCARDIAL PERFUSION IMAGING     Other   Hyperlipidemia, mixed (Chronic)   Relevant Medications   chlorthalidone (HYGROTON) 25 MG tablet   potassium chloride (KLOR-CON M) 10 MEQ tablet   Other Relevant Orders   PCV ECHOCARDIOGRAM COMPLETE   MYOCARDIAL PERFUSION IMAGING   Other Visit Diagnoses     SOB (shortness of breath)       has SOB getting worse, advise echo, stress test   Relevant Medications   chlorthalidone (HYGROTON) 25 MG tablet   potassium chloride (KLOR-CON M) 10 MEQ tablet   Other Relevant Orders   PCV ECHOCARDIOGRAM COMPLETE   MYOCARDIAL PERFUSION IMAGING          Disposition:   Return in about 2 weeks (around 10/06/2023) for echo, stress test and f/u.    Total time spent: 40 minutes  Signed,  Adrian Blackwater, MD  09/22/2023 1:28 PM    [facility]

## 2023-09-23 ENCOUNTER — Encounter: Payer: Self-pay | Admitting: Urology

## 2023-09-29 ENCOUNTER — Ambulatory Visit: Payer: Managed Care, Other (non HMO) | Admitting: Cardiology

## 2023-10-02 ENCOUNTER — Ambulatory Visit (INDEPENDENT_AMBULATORY_CARE_PROVIDER_SITE_OTHER): Payer: Managed Care, Other (non HMO)

## 2023-10-02 DIAGNOSIS — I25119 Atherosclerotic heart disease of native coronary artery with unspecified angina pectoris: Secondary | ICD-10-CM

## 2023-10-02 DIAGNOSIS — E782 Mixed hyperlipidemia: Secondary | ICD-10-CM

## 2023-10-02 DIAGNOSIS — I25118 Atherosclerotic heart disease of native coronary artery with other forms of angina pectoris: Secondary | ICD-10-CM

## 2023-10-02 DIAGNOSIS — I209 Angina pectoris, unspecified: Secondary | ICD-10-CM

## 2023-10-02 DIAGNOSIS — R0602 Shortness of breath: Secondary | ICD-10-CM | POA: Diagnosis not present

## 2023-10-02 DIAGNOSIS — I10 Essential (primary) hypertension: Secondary | ICD-10-CM

## 2023-10-02 MED ORDER — TECHNETIUM TC 99M SESTAMIBI GENERIC - CARDIOLITE
32.3000 | Freq: Once | INTRAVENOUS | Status: AC | PRN
  Administered 2023-10-02: 32.3 via INTRAVENOUS

## 2023-10-02 MED ORDER — TECHNETIUM TC 99M SESTAMIBI GENERIC - CARDIOLITE
10.3000 | Freq: Once | INTRAVENOUS | Status: AC | PRN
  Administered 2023-10-02: 10.3 via INTRAVENOUS

## 2023-10-06 ENCOUNTER — Ambulatory Visit (INDEPENDENT_AMBULATORY_CARE_PROVIDER_SITE_OTHER): Payer: Managed Care, Other (non HMO) | Admitting: Cardiology

## 2023-10-06 ENCOUNTER — Encounter: Payer: Self-pay | Admitting: Cardiology

## 2023-10-06 VITALS — BP 128/86 | HR 66 | Ht 74.0 in | Wt 213.2 lb

## 2023-10-06 DIAGNOSIS — I10 Essential (primary) hypertension: Secondary | ICD-10-CM | POA: Diagnosis not present

## 2023-10-06 DIAGNOSIS — E039 Hypothyroidism, unspecified: Secondary | ICD-10-CM | POA: Diagnosis not present

## 2023-10-06 DIAGNOSIS — E782 Mixed hyperlipidemia: Secondary | ICD-10-CM

## 2023-10-06 DIAGNOSIS — L309 Dermatitis, unspecified: Secondary | ICD-10-CM | POA: Diagnosis not present

## 2023-10-06 MED ORDER — ESOMEPRAZOLE MAGNESIUM 40 MG PO CPDR: 40.0000 mg | DELAYED_RELEASE_CAPSULE | Freq: Every day | ORAL | 1 refills | Status: DC

## 2023-10-06 NOTE — Progress Notes (Signed)
Established Patient Office Visit  Subjective:  Patient ID: Jesse May, male    DOB: 1956/09/25  Age: 67 y.o. MRN: [id number]  Chief Complaint  Patient presents with   Follow-up    4 month follow up    Patient in office for 4 month follow up. Patient doing well. No acute complaints.  States he has taken Nexium in the past for acid reflux, worked better than Protonix. Will send in Nexium.  Saw dermatology in the past, had a skin biopsy. Wants to be referred elsewhere for second opinion.     No other concerns at this time.   Past Medical History:  Diagnosis Date   MI (myocardial infarction) [facility])     Past Surgical History:  Procedure Laterality Date   APPENDECTOMY     CARDIAC CATHETERIZATION  2015   stent left circumflex-per Dr. Leonard Schwartz. Gwen Pounds note Care Everywhere   CHOLECYSTECTOMY     CORONARY BALLOON ANGIOPLASTY N/A 03/26/2023   Procedure: CORONARY BALLOON ANGIOPLASTY;  Surgeon: Marykay Lex, MD;  Location: [facility] INVASIVE CV LAB;  Service: Cardiovascular;  Laterality: N/A;   LEFT HEART CATH AND CORONARY ANGIOGRAPHY Left 03/26/2023   Procedure: LEFT HEART CATH AND CORONARY ANGIOGRAPHY;  Surgeon: Laurier Nancy, MD;  Location: [facility] INVASIVE CV LAB;  Service: Cardiovascular;  Laterality: Left;   TONSILLECTOMY      Social History   Socioeconomic History   Marital status: Divorced    Spouse name: Not on file   Number of children: Not on file   Years of education: Not on file   Highest education level: Not on file  Occupational History   Not on file  Tobacco Use   Smoking status: Never   Smokeless tobacco: Never  Vaping Use   Vaping status: Never Used  Substance and Sexual Activity   Alcohol use: Not Currently   Drug use: Not Currently   Sexual activity: Not on file  Other Topics Concern   Not on file  Social History Narrative   Lives alone.  Jesse May, here today. Has pet, dog.    Social Drivers of Corporate investment banker Strain: Not on file   Food Insecurity: Not on file  Transportation Needs: Not on file  Physical Activity: Not on file  Stress: Not on file  Social Connections: Not on file  Intimate Partner Violence: Not on file    History reviewed. No pertinent family history.  No Known Allergies  Outpatient Medications Prior to Visit  Medication Sig   Aspirin 81 MG CAPS Aspirin 81 MG Oral Capsule QTY: 0 capsule Days: 0 Refills: 0  Written: 02/07/21 Patient Instructions:   chlorthalidone (HYGROTON) 25 MG tablet Take 1 tablet (25 mg total) by mouth daily.   clopidogrel (PLAVIX) 75 MG tablet Take 1 tablet (75 mg total) by mouth daily.   isosorbide mononitrate (IMDUR) 30 MG 24 hr tablet Take 1 tablet by mouth once daily   levothyroxine (SYNTHROID) 137 MCG tablet Take 1 tablet (137 mcg total) by mouth daily before breakfast.   losartan (COZAAR) 100 MG tablet Take 1 tablet (100 mg total) by mouth daily.   potassium chloride (KLOR-CON M) 10 MEQ tablet Take 1 tablet (10 mEq total) by mouth 2 (two) times daily.   rosuvastatin (CRESTOR) 40 MG tablet Take 40 mg by mouth daily.   traZODone (DESYREL) 100 MG tablet TAKE 1 TABLET BY MOUTH NIGHTLY AT BEDTIME AS NEEDED FOR SLEEP (NOTE  INCREASED  DOSAGE)   [DISCONTINUED] pantoprazole (  PROTONIX) 40 MG tablet Take 1 tablet (40 mg total) by mouth daily.   nitroGLYCERIN (NITROSTAT) 0.4 MG SL tablet Place 1 tablet (0.4 mg total) under the tongue every 5 (five) minutes as needed for chest pain. (Patient not taking: Reported on 10/06/2023)   No facility-administered medications prior to visit.    Review of Systems  Constitutional: Negative.   HENT: Negative.    Eyes: Negative.   Respiratory: Negative.  Negative for shortness of breath.   Cardiovascular: Negative.  Negative for chest pain.  Gastrointestinal:  Positive for heartburn. Negative for abdominal pain, constipation and diarrhea.  Genitourinary: Negative.   Musculoskeletal:  Negative for joint pain and myalgias.  Skin: Negative.    Neurological: Negative.  Negative for dizziness and headaches.  Endo/Heme/Allergies: Negative.   All other systems reviewed and are negative.      Objective:   BP 128/86   Pulse 66   Ht 6\' 2"  (1.88 m)   Wt 213 lb 3.2 oz (96.7 kg)   SpO2 97%   BMI 27.37 kg/m   Vitals:   10/06/23 1341  BP: 128/86  Pulse: 66  Height: 6\' 2"  (1.88 m)  Weight: 213 lb 3.2 oz (96.7 kg)  SpO2: 97%  BMI (Calculated): 27.36    Physical Exam Nursing note reviewed.  Constitutional:      Appearance: Normal appearance. He is normal weight.  HENT:     Head: Normocephalic and atraumatic.     Nose: Nose normal.     Mouth/Throat:     Mouth: Mucous membranes are moist.     Pharynx: Oropharynx is clear.  Eyes:     Extraocular Movements: Extraocular movements intact.     Conjunctiva/sclera: Conjunctivae normal.     Pupils: Pupils are equal, round, and reactive to light.  Cardiovascular:     Rate and Rhythm: Normal rate and regular rhythm.     Pulses: Normal pulses.     Heart sounds: Normal heart sounds.  Pulmonary:     Effort: Pulmonary effort is normal.     Breath sounds: Normal breath sounds.  Abdominal:     General: Abdomen is flat. Bowel sounds are normal.     Palpations: Abdomen is soft.  Musculoskeletal:        General: Normal range of motion.     Cervical back: Normal range of motion.  Skin:    General: Skin is warm and dry.  Neurological:     General: No focal deficit present.     Mental Status: He is alert and oriented to person, place, and time.  Psychiatric:        Mood and Affect: Mood normal.        Behavior: Behavior normal.        Thought Content: Thought content normal.        Judgment: Judgment normal.      No results found for any visits on 10/06/23.  No results found for this or any previous visit (from the past 2160 hours).    Assessment & Plan:  Nexium sent to the pharmacy. Referral sent to dermatology.   Problem List Items Addressed This Visit        Cardiovascular and Mediastinum   Essential hypertension - Primary (Chronic)     Endocrine   Acquired hypothyroidism     Musculoskeletal and Integument   Eczema   Relevant Orders   Ambulatory referral to Dermatology     Other   Hyperlipidemia, mixed (Chronic)    Return in  about 4 months (around 02/04/2024) for with fasting labs prior.   Total time spent: 25 minutes  [employer], NP  10/06/2023   This document may have been prepared by Dragon Voice Recognition software and as such may include unintentional dictation errors.

## 2023-10-07 ENCOUNTER — Ambulatory Visit: Payer: Managed Care, Other (non HMO)

## 2023-10-07 DIAGNOSIS — I25118 Atherosclerotic heart disease of native coronary artery with other forms of angina pectoris: Secondary | ICD-10-CM

## 2023-10-07 DIAGNOSIS — I361 Nonrheumatic tricuspid (valve) insufficiency: Secondary | ICD-10-CM

## 2023-10-07 DIAGNOSIS — R0602 Shortness of breath: Secondary | ICD-10-CM

## 2023-10-07 DIAGNOSIS — I371 Nonrheumatic pulmonary valve insufficiency: Secondary | ICD-10-CM

## 2023-10-07 DIAGNOSIS — I351 Nonrheumatic aortic (valve) insufficiency: Secondary | ICD-10-CM | POA: Diagnosis not present

## 2023-10-07 DIAGNOSIS — I10 Essential (primary) hypertension: Secondary | ICD-10-CM

## 2023-10-07 DIAGNOSIS — I209 Angina pectoris, unspecified: Secondary | ICD-10-CM

## 2023-10-07 DIAGNOSIS — E782 Mixed hyperlipidemia: Secondary | ICD-10-CM

## 2023-10-07 DIAGNOSIS — I25119 Atherosclerotic heart disease of native coronary artery with unspecified angina pectoris: Secondary | ICD-10-CM

## 2023-10-08 ENCOUNTER — Encounter: Payer: Self-pay | Admitting: Cardiovascular Disease

## 2023-10-08 ENCOUNTER — Ambulatory Visit: Payer: Managed Care, Other (non HMO) | Admitting: Cardiovascular Disease

## 2023-10-08 VITALS — BP 144/84 | HR 67 | Ht 74.0 in | Wt 215.8 lb

## 2023-10-08 DIAGNOSIS — G4733 Obstructive sleep apnea (adult) (pediatric): Secondary | ICD-10-CM

## 2023-10-08 DIAGNOSIS — R0789 Other chest pain: Secondary | ICD-10-CM

## 2023-10-08 DIAGNOSIS — R0602 Shortness of breath: Secondary | ICD-10-CM

## 2023-10-08 DIAGNOSIS — E782 Mixed hyperlipidemia: Secondary | ICD-10-CM

## 2023-10-08 DIAGNOSIS — I25119 Atherosclerotic heart disease of native coronary artery with unspecified angina pectoris: Secondary | ICD-10-CM | POA: Diagnosis not present

## 2023-10-08 DIAGNOSIS — I209 Angina pectoris, unspecified: Secondary | ICD-10-CM

## 2023-10-08 DIAGNOSIS — I10 Essential (primary) hypertension: Secondary | ICD-10-CM

## 2023-10-08 DIAGNOSIS — Z955 Presence of coronary angioplasty implant and graft: Secondary | ICD-10-CM

## 2023-10-08 MED ORDER — AMLODIPINE BESYLATE 10 MG PO TABS: 10.0000 mg | ORAL_TABLET | Freq: Every day | ORAL | 11 refills | Status: AC

## 2023-10-08 NOTE — Progress Notes (Signed)
Cardiology Office Note   Date:  10/08/2023   ID:  Jesse May, DOB 19-Aug-1956, MRN [id number]  PCP:  Marisue Ivan, NP  Cardiologist:  Adrian Blackwater, MD      History of Present Illness: Jesse May is a 67 y.o. male who presents for  Chief Complaint  Patient presents with   Results    ECHO/NST Results    Breathing got easier      Past Medical History:  Diagnosis Date   MI (myocardial infarction) [facility])      Past Surgical History:  Procedure Laterality Date   APPENDECTOMY     CARDIAC CATHETERIZATION  2015   stent left circumflex-per Dr. Leonard Schwartz. Gwen Pounds note Care Everywhere   CHOLECYSTECTOMY     CORONARY BALLOON ANGIOPLASTY N/A 03/26/2023   Procedure: CORONARY BALLOON ANGIOPLASTY;  Surgeon: Marykay Lex, MD;  Location: [facility] INVASIVE CV LAB;  Service: Cardiovascular;  Laterality: N/A;   LEFT HEART CATH AND CORONARY ANGIOGRAPHY Left 03/26/2023   Procedure: LEFT HEART CATH AND CORONARY ANGIOGRAPHY;  Surgeon: Laurier Nancy, MD;  Location: [facility] INVASIVE CV LAB;  Service: Cardiovascular;  Laterality: Left;   TONSILLECTOMY       Current Outpatient Medications  Medication Sig Dispense Refill   amLODipine (NORVASC) 10 MG tablet Take 1 tablet (10 mg total) by mouth daily. 30 tablet 11   Aspirin 81 MG CAPS Aspirin 81 MG Oral Capsule QTY: 0 capsule Days: 0 Refills: 0  Written: 02/07/21 Patient Instructions:     chlorthalidone (HYGROTON) 25 MG tablet Take 1 tablet (25 mg total) by mouth daily. 30 tablet 1   clopidogrel (PLAVIX) 75 MG tablet Take 1 tablet (75 mg total) by mouth daily. 30 tablet 11   esomeprazole (NEXIUM) 40 MG capsule Take 1 capsule (40 mg total) by mouth daily. 30 capsule 1   isosorbide mononitrate (IMDUR) 30 MG 24 hr tablet Take 1 tablet by mouth once daily 60 tablet 0   levothyroxine (SYNTHROID) 137 MCG tablet Take 1 tablet (137 mcg total) by mouth daily before breakfast. 90 tablet 2   losartan (COZAAR) 100 MG tablet Take 1 tablet (100 mg total) by mouth  daily. 30 tablet 11   nitroGLYCERIN (NITROSTAT) 0.4 MG SL tablet Place 1 tablet (0.4 mg total) under the tongue every 5 (five) minutes as needed for chest pain. 100 tablet 3   potassium chloride (KLOR-CON M) 10 MEQ tablet Take 1 tablet (10 mEq total) by mouth 2 (two) times daily. 30 tablet 1   rosuvastatin (CRESTOR) 40 MG tablet Take 40 mg by mouth daily.     traZODone (DESYREL) 100 MG tablet TAKE 1 TABLET BY MOUTH NIGHTLY AT BEDTIME AS NEEDED FOR SLEEP (NOTE  INCREASED  DOSAGE) 90 tablet 0   No current facility-administered medications for this visit.    Allergies:   Patient has no known allergies.    Social History:   reports that he has never smoked. He has never used smokeless tobacco. He reports that he does not currently use alcohol. He reports that he does not currently use drugs.   Family History:  family history is not on file.    ROS:     Review of Systems  Constitutional: Negative.   HENT: Negative.    Eyes: Negative.   Respiratory: Negative.    Gastrointestinal: Negative.   Genitourinary: Negative.   Musculoskeletal: Negative.   Skin: Negative.   Neurological: Negative.   Endo/Heme/Allergies: Negative.   Psychiatric/Behavioral: Negative.    All  other systems reviewed and are negative.     All other systems are reviewed and negative.    PHYSICAL EXAM: VS:  BP (!) 144/84   Pulse 67   Ht 6\' 2"  (1.88 m)   Wt 215 lb 12.8 oz (97.9 kg)   SpO2 97%   BMI 27.71 kg/m  , BMI Body mass index is 27.71 kg/m. Last weight:  Wt Readings from Last 3 Encounters:  10/08/23 215 lb 12.8 oz (97.9 kg)  10/06/23 213 lb 3.2 oz (96.7 kg)  09/22/23 219 lb 6.4 oz (99.5 kg)     Physical Exam Vitals reviewed.  Constitutional:      Appearance: Normal appearance. He is normal weight.  HENT:     Head: Normocephalic.     Nose: Nose normal.     Mouth/Throat:     Mouth: Mucous membranes are moist.  Eyes:     Pupils: Pupils are equal, round, and reactive to light.   Cardiovascular:     Rate and Rhythm: Normal rate and regular rhythm.     Pulses: Normal pulses.     Heart sounds: Normal heart sounds.  Pulmonary:     Effort: Pulmonary effort is normal.  Abdominal:     General: Abdomen is flat. Bowel sounds are normal.  Musculoskeletal:        General: Normal range of motion.     Cervical back: Normal range of motion.  Skin:    General: Skin is warm.  Neurological:     General: No focal deficit present.     Mental Status: He is alert.  Psychiatric:        Mood and Affect: Mood normal.       EKG:   Recent Labs: 03/27/2023: Hemoglobin 15.4; Platelets 179 06/24/2023: ALT 17; BUN 13; Creatinine, Ser 1.22; Potassium 4.3; Sodium 138; TSH 2.420    Lipid Panel    Component Value Date/Time   CHOL 79 (L) 06/24/2023 0955   TRIG 50 06/24/2023 0955   HDL 37 (L) 06/24/2023 0955   CHOLHDL 2.1 06/24/2023 0955   LDLCALC 29 06/24/2023 0955      Other studies Reviewed: Additional studies/ records that were reviewed today include:  Review of the above records demonstrates:       No data to display            ASSESSMENT AND PLAN:    ICD-10-CM   1. Angina, class III (HCC)  I20.9 amLODipine (NORVASC) 10 MG tablet    2. Essential hypertension  I10 amLODipine (NORVASC) 10 MG tablet   Repeat BP 150/80, add amlodapine 10 mgmg    3. Coronary artery disease involving native coronary artery of native heart with angina pectoris (HCC)  I25.119 amLODipine (NORVASC) 10 MG tablet    4. Obstructive sleep apnea  G47.33 amLODipine (NORVASC) 10 MG tablet    5. Hyperlipidemia, mixed  E78.2 amLODipine (NORVASC) 10 MG tablet    6. 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 ADVISE CONTINUE MEDICAL THERAPY    7. Other chest pain  R07.89 amLODipine (NORVASC) 10 MG tablet   Says had occasional twinge in  chest lasting few seconds    8. SOB (shortness of breath)  R06.02 amLODipine (NORVASC) 10 MG tablet   Improved       Problem List Items Addressed This Visit  Cardiovascular and Mediastinum   Essential hypertension (Chronic)   Relevant Medications   amLODipine (NORVASC) 10 MG tablet   Coronary artery disease involving native heart with angina pectoris (HCC) (Chronic)   Relevant Medications   amLODipine (NORVASC) 10 MG tablet   Angina, class III (HCC) - Primary   Relevant Medications   amLODipine (NORVASC) 10 MG tablet     Respiratory   Obstructive sleep apnea   Relevant Medications   amLODipine (NORVASC) 10 MG tablet     Other   Hyperlipidemia, mixed (Chronic)   Relevant Medications   amLODipine (NORVASC) 10 MG tablet   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    Total time spent: 30 minutes  Signed,  Adrian Blackwater, MD  10/08/2023 1:53 PM    [facility]

## 2023-10-15 ENCOUNTER — Ambulatory Visit: Payer: Managed Care, Other (non HMO) | Admitting: Cardiology

## 2023-10-19 ENCOUNTER — Telehealth: Payer: Self-pay | Admitting: Cardiovascular Disease

## 2023-10-19 NOTE — Telephone Encounter (Signed)
Patient called stating that he has checked with [facility] in [place] 2 different times and [facility] are stating they never received the rx for his amlodipine. Please adv

## 2023-10-20 ENCOUNTER — Other Ambulatory Visit: Payer: Self-pay | Admitting: Cardiovascular Disease

## 2023-10-20 DIAGNOSIS — R0789 Other chest pain: Secondary | ICD-10-CM

## 2023-10-28 ENCOUNTER — Ambulatory Visit: Payer: Managed Care, Other (non HMO) | Admitting: Urology

## 2023-10-30 ENCOUNTER — Ambulatory Visit: Payer: Managed Care, Other (non HMO) | Admitting: Urology

## 2023-11-26 ENCOUNTER — Ambulatory Visit: Payer: Managed Care, Other (non HMO) | Admitting: Urology

## 2023-12-03 ENCOUNTER — Other Ambulatory Visit: Payer: Self-pay | Admitting: Cardiovascular Disease

## 2023-12-03 DIAGNOSIS — R0602 Shortness of breath: Secondary | ICD-10-CM

## 2023-12-03 DIAGNOSIS — I10 Essential (primary) hypertension: Secondary | ICD-10-CM

## 2023-12-03 DIAGNOSIS — I25118 Atherosclerotic heart disease of native coronary artery with other forms of angina pectoris: Secondary | ICD-10-CM

## 2023-12-03 DIAGNOSIS — I209 Angina pectoris, unspecified: Secondary | ICD-10-CM

## 2023-12-03 DIAGNOSIS — E782 Mixed hyperlipidemia: Secondary | ICD-10-CM

## 2023-12-03 DIAGNOSIS — I25119 Atherosclerotic heart disease of native coronary artery with unspecified angina pectoris: Secondary | ICD-10-CM

## 2023-12-07 ENCOUNTER — Ambulatory Visit: Payer: Managed Care, Other (non HMO) | Admitting: Urology

## 2023-12-07 ENCOUNTER — Encounter: Payer: Self-pay | Admitting: Urology

## 2023-12-07 VITALS — BP 147/79 | HR 63 | Ht 74.0 in | Wt 216.0 lb

## 2023-12-07 DIAGNOSIS — N486 Induration penis plastica: Secondary | ICD-10-CM | POA: Diagnosis not present

## 2023-12-07 NOTE — Progress Notes (Signed)
I, Maysun Anabel Bene, acting as a scribe for [name], MD., have documented all relevant documentation on the behalf of [name], MD, as directed by [name], MD while in the presence of [name], MD.  12/07/2023 2:43 PM   Jesse May January 16, 1956 [id number]  Referring provider: Orson Eva, NP [address] [place],  [place] [address]  Chief Complaint  Patient presents with   Abnormal Penile Curvature   Urologic history: Peyronie's disease First cycle of Xiaflex completed 01/08/23.    HPI: Jesse May is a 68 y.o. male called for a follow-up appointment to discuss Peyronie's disease.  First cycle of Xiaflex completed 01/08/23.  At his last visit, he was interested in scheduling cycle 2, however his insurance or his employer changed their insurance coverage, and his new insurance company has not yet approved a cycle to a Xiaflex.  He wanted to discuss other treatment options.    PMH: Past Medical History:  Diagnosis Date   MI (myocardial infarction) [facility])     Surgical History: Past Surgical History:  Procedure Laterality Date   APPENDECTOMY     CARDIAC CATHETERIZATION  2015   stent left circumflex-per Dr. Leonard Schwartz. Gwen Pounds note Care Everywhere   CHOLECYSTECTOMY     CORONARY BALLOON ANGIOPLASTY N/A 03/26/2023   Procedure: CORONARY BALLOON ANGIOPLASTY;  Surgeon: Marykay Lex, MD;  Location: [facility] INVASIVE CV LAB;  Service: Cardiovascular;  Laterality: N/A;   LEFT HEART CATH AND CORONARY ANGIOGRAPHY Left 03/26/2023   Procedure: LEFT HEART CATH AND CORONARY ANGIOGRAPHY;  Surgeon: Laurier Nancy, MD;  Location: [facility] INVASIVE CV LAB;  Service: Cardiovascular;  Laterality: Left;   TONSILLECTOMY      Home Medications:  Allergies as of 12/07/2023   No Known Allergies      Medication List        Accurate as of December 07, 2023  2:43 PM. If you have any questions, ask your nurse or doctor.          amLODipine 10 MG tablet Commonly known as:  NORVASC Take 1 tablet (10 mg total) by mouth daily.   Aspirin 81 MG Caps Aspirin 81 MG Oral Capsule QTY: 0 capsule Days: 0 Refills: 0  Written: 02/07/21 Patient Instructions:   chlorthalidone 25 MG tablet Commonly known as: HYGROTON Take 1 tablet (25 mg total) by mouth daily.   clopidogrel 75 MG tablet Commonly known as: Plavix Take 1 tablet (75 mg total) by mouth daily.   esomeprazole 40 MG capsule Commonly known as: NexIUM Take 1 capsule (40 mg total) by mouth daily.   isosorbide mononitrate 30 MG 24 hr tablet Commonly known as: IMDUR Take 1 tablet by mouth once daily   levothyroxine 137 MCG tablet Commonly known as: SYNTHROID Take 1 tablet (137 mcg total) by mouth daily before breakfast.   losartan 100 MG tablet Commonly known as: Cozaar Take 1 tablet (100 mg total) by mouth daily.   nitroGLYCERIN 0.4 MG SL tablet Commonly known as: Nitrostat Place 1 tablet (0.4 mg total) under the tongue every 5 (five) minutes as needed for chest pain.   potassium chloride 10 MEQ tablet Commonly known as: KLOR-CON Take 1 tablet by mouth twice daily   rosuvastatin 40 MG tablet Commonly known as: CRESTOR Take 40 mg by mouth daily.   traZODone 100 MG tablet Commonly known as: DESYREL TAKE 1 TABLET BY MOUTH NIGHTLY AT BEDTIME AS NEEDED FOR SLEEP (NOTE  INCREASED  DOSAGE)  Allergies: No Known Allergies   Social History:  reports that he has never smoked. He has never used smokeless tobacco. He reports that he does not currently use alcohol. He reports that he does not currently use drugs.   Physical Exam: BP (!) 147/79   Pulse 63   Ht 6\' 2"  (1.88 m)   Wt 216 lb (98 kg)   BMI 27.73 kg/m   Constitutional:  Alert and oriented, No acute distress. HEENT: [place] AT Respiratory: Normal respiratory effort, no increased work of breathing. Psychiatric: Normal mood and affect.   Assessment & Plan:    1. Peyronie's disease We briefly discussed surgical options and if  interested in additional information, I recommend an appointment with Dr. Lafonda Mosses in [place] or at [facility]. He has requested referral to [facility].  His insurance company has not denied the [place], however he states he keeps getting the runaround and will continue to check with his insurance company.  I have reviewed the above documentation for accuracy and completeness, and I agree with the above.   [name], MD  [facility] [address] [place], [place] [address] [phone]

## 2023-12-09 ENCOUNTER — Other Ambulatory Visit: Payer: Self-pay | Admitting: Cardiology

## 2023-12-10 ENCOUNTER — Encounter: Payer: Self-pay | Admitting: Urology

## 2023-12-10 ENCOUNTER — Ambulatory Visit: Payer: Managed Care, Other (non HMO) | Admitting: Cardiovascular Disease

## 2023-12-10 ENCOUNTER — Ambulatory Visit: Payer: Managed Care, Other (non HMO) | Admitting: Cardiology

## 2023-12-11 ENCOUNTER — Encounter: Payer: Self-pay | Admitting: Cardiology

## 2023-12-11 ENCOUNTER — Ambulatory Visit (INDEPENDENT_AMBULATORY_CARE_PROVIDER_SITE_OTHER): Payer: Managed Care, Other (non HMO) | Admitting: Cardiology

## 2023-12-11 VITALS — BP 122/78 | HR 64 | Ht 74.0 in | Wt 224.0 lb

## 2023-12-11 DIAGNOSIS — I25119 Atherosclerotic heart disease of native coronary artery with unspecified angina pectoris: Secondary | ICD-10-CM

## 2023-12-11 DIAGNOSIS — I10 Essential (primary) hypertension: Secondary | ICD-10-CM

## 2023-12-11 DIAGNOSIS — E782 Mixed hyperlipidemia: Secondary | ICD-10-CM

## 2023-12-11 DIAGNOSIS — R0602 Shortness of breath: Secondary | ICD-10-CM | POA: Diagnosis not present

## 2023-12-11 MED ORDER — FUROSEMIDE 20 MG PO TABS: 20.0000 mg | ORAL_TABLET | Freq: Every day | ORAL | 11 refills | Status: AC

## 2023-12-11 NOTE — Progress Notes (Signed)
Cardiology Office Note   Date:  12/11/2023   ID:  Jesse May, DOB [birth year], MRN [id number]  PCP:  Marisue Ivan, NP  Cardiologist:  Marisue Ivan, NP      History of Present Illness: Jesse May is a 68 y.o. male who presents for  Chief Complaint  Patient presents with   Follow-up    2 Months Follow Up Cards    Patient in office for 2 month follow up. Complains of shortness of breath on exertion, walking up stairs at work. Also reports bilateral lower extremity edema at the end of the day.       Past Medical History:  Diagnosis Date   MI (myocardial infarction) [facility])      Past Surgical History:  Procedure Laterality Date   APPENDECTOMY     CARDIAC CATHETERIZATION  2015   stent left circumflex-per Dr. Leonard Schwartz. Gwen Pounds note Care Everywhere   CHOLECYSTECTOMY     CORONARY BALLOON ANGIOPLASTY N/A 03/26/2023   Procedure: CORONARY BALLOON ANGIOPLASTY;  Surgeon: Marykay Lex, MD;  Location: [facility] INVASIVE CV LAB;  Service: Cardiovascular;  Laterality: N/A;   LEFT HEART CATH AND CORONARY ANGIOGRAPHY Left 03/26/2023   Procedure: LEFT HEART CATH AND CORONARY ANGIOGRAPHY;  Surgeon: Laurier Nancy, MD;  Location: [facility] INVASIVE CV LAB;  Service: Cardiovascular;  Laterality: Left;   TONSILLECTOMY       Current Outpatient Medications  Medication Sig Dispense Refill   furosemide (LASIX) 20 MG tablet Take 1 tablet (20 mg total) by mouth daily. 30 tablet 11   amLODipine (NORVASC) 10 MG tablet Take 1 tablet (10 mg total) by mouth daily. 30 tablet 11   Aspirin 81 MG CAPS Aspirin 81 MG Oral Capsule QTY: 0 capsule Days: 0 Refills: 0  Written: 02/07/21 Patient Instructions:     chlorthalidone (HYGROTON) 25 MG tablet Take 1 tablet (25 mg total) by mouth daily. 30 tablet 1   clopidogrel (PLAVIX) 75 MG tablet Take 1 tablet (75 mg total) by mouth daily. 30 tablet 11   esomeprazole (NEXIUM) 40 MG capsule Take 1 capsule by mouth once daily 60 capsule 0   isosorbide mononitrate (IMDUR) 30  MG 24 hr tablet Take 1 tablet by mouth once daily 60 tablet 0   levothyroxine (SYNTHROID) 137 MCG tablet Take 1 tablet (137 mcg total) by mouth daily before breakfast. 90 tablet 2   losartan (COZAAR) 100 MG tablet Take 1 tablet (100 mg total) by mouth daily. 30 tablet 11   nitroGLYCERIN (NITROSTAT) 0.4 MG SL tablet Place 1 tablet (0.4 mg total) under the tongue every 5 (five) minutes as needed for chest pain. 100 tablet 3   potassium chloride (KLOR-CON) 10 MEQ tablet Take 1 tablet by mouth twice daily 30 tablet 0   rosuvastatin (CRESTOR) 40 MG tablet Take 40 mg by mouth daily.     traZODone (DESYREL) 100 MG tablet TAKE 1 TABLET BY MOUTH NIGHTLY AT BEDTIME AS NEEDED FOR SLEEP (NOTE  INCREASED  DOSAGE) 90 tablet 0   No current facility-administered medications for this visit.    Allergies:   Patient has no known allergies.    Social History:   reports that he has never smoked. He has never used smokeless tobacco. He reports that he does not currently use alcohol. He reports that he does not currently use drugs.   Family History:  family history is not on file.    ROS:     Review of Systems  Constitutional: Negative.  HENT: Negative.    Eyes: Negative.   Respiratory:  Positive for shortness of breath.   Cardiovascular:  Positive for leg swelling.  Gastrointestinal: Negative.   Genitourinary: Negative.   Musculoskeletal: Negative.   Skin: Negative.   Neurological: Negative.   Endo/Heme/Allergies: Negative.   Psychiatric/Behavioral: Negative.    All other systems reviewed and are negative.     All other systems are reviewed and negative.    PHYSICAL EXAM: VS:  BP 122/78 (BP Location: Right Arm, Patient Position: Sitting, Cuff Size: Large)   Pulse 64   Ht 6\' 2"  (1.88 m)   Wt 224 lb (101.6 kg)   SpO2 97%   BMI 28.76 kg/m  , BMI Body mass index is 28.76 kg/m. Last weight:  Wt Readings from Last 3 Encounters:  12/11/23 224 lb (101.6 kg)  12/07/23 216 lb (98 kg)  10/08/23  215 lb 12.8 oz (97.9 kg)     Physical Exam Vitals and nursing note reviewed.  Constitutional:      Appearance: Normal appearance. He is normal weight.  HENT:     Head: Normocephalic and atraumatic.     Nose: Nose normal.     Mouth/Throat:     Mouth: Mucous membranes are moist.     Pharynx: Oropharynx is clear.  Eyes:     Extraocular Movements: Extraocular movements intact.     Conjunctiva/sclera: Conjunctivae normal.     Pupils: Pupils are equal, round, and reactive to light.  Cardiovascular:     Rate and Rhythm: Normal rate and regular rhythm.     Pulses: Normal pulses.     Heart sounds: Normal heart sounds.  Pulmonary:     Effort: Pulmonary effort is normal.     Breath sounds: Normal breath sounds.  Abdominal:     General: Abdomen is flat. Bowel sounds are normal.     Palpations: Abdomen is soft.  Musculoskeletal:        General: Normal range of motion.     Cervical back: Normal range of motion.  Skin:    General: Skin is warm and dry.  Neurological:     General: No focal deficit present.     Mental Status: He is alert and oriented to person, place, and time. Mental status is at baseline.  Psychiatric:        Mood and Affect: Mood normal.        Behavior: Behavior normal.        Thought Content: Thought content normal.        Judgment: Judgment normal.      EKG: none today  Recent Labs: 03/27/2023: Hemoglobin 15.4; Platelets 179 06/24/2023: ALT 17; BUN 13; Creatinine, Ser 1.22; Potassium 4.3; Sodium 138; TSH 2.420    Lipid Panel    Component Value Date/Time   CHOL 79 (L) 06/24/2023 0955   TRIG 50 06/24/2023 0955   HDL 37 (L) 06/24/2023 0955   CHOLHDL 2.1 06/24/2023 0955   LDLCALC 29 06/24/2023 0955      ASSESSMENT AND PLAN:    ICD-10-CM   1. Coronary artery disease involving native coronary artery of native heart with angina pectoris (HCC)  I25.119     2. Essential hypertension  I10     3. Hyperlipidemia, mixed  E78.2     4. SOB (shortness of  breath)  R06.02        Problem List Items Addressed This Visit       Cardiovascular and Mediastinum   Essential hypertension (Chronic)  Blood pressure improved on recheck. Continue same medications.       Relevant Medications   furosemide (LASIX) 20 MG tablet   Coronary artery disease involving native heart with angina pectoris (HCC) - Primary (Chronic)   LHC 03/2023 PCI of mid LCX as has restenosis of LCX, moderate mid LAD and distal RCA and osteal LAD 50%. Advise PCI of LCX.  Successful scoring balloon angioplasty/PTCA of 90% ISR of LCx stent reducing 90% to 0% with TIMI-3 flow pre and post.  Stress test 09/2023 STRESS TEST SHOWS INFERIOR WALL REVERSIBLE DEFECT, BUT NOT MUCH ANGINA. ADVISE CONTINUE MEDICAL THERAPY.  Denies chest pain today.       Relevant Medications   furosemide (LASIX) 20 MG tablet     Other   Hyperlipidemia, mixed (Chronic)   06/2023 LDL 29, continue same medication.       Relevant Medications   furosemide (LASIX) 20 MG tablet   SOB (shortness of breath)   Patient complains of shortness of breath when walking up stairs at work, LE edema bilaterally. Will add furosemide 20 mg daily for a few days, then take as needed. Echo 09/2023 EF 65%, normal diastolic function.           Disposition:   Return in about 4 weeks (around 01/08/2024) for cards.    Total time spent: 25 minutes  Signed,  [place], NP  12/11/2023 1:43 PM    [facility]

## 2023-12-11 NOTE — Assessment & Plan Note (Signed)
Blood pressure improved on recheck. Continue same medications.

## 2023-12-11 NOTE — Assessment & Plan Note (Signed)
06/2023 LDL 29, continue same medication.

## 2023-12-11 NOTE — Assessment & Plan Note (Addendum)
Patient complains of shortness of breath when walking up stairs at work, LE edema bilaterally. Will add furosemide 20 mg daily for a few days, then take as needed. Echo 09/2023 EF 65%, normal diastolic function.

## 2023-12-11 NOTE — Assessment & Plan Note (Signed)
LHC 03/2023 PCI of mid LCX as has restenosis of LCX, moderate mid LAD and distal RCA and osteal LAD 50%. Advise PCI of LCX.  Successful scoring balloon angioplasty/PTCA of 90% ISR of LCx stent reducing 90% to 0% with TIMI-3 flow pre and post.  Stress test 09/2023 STRESS TEST SHOWS INFERIOR WALL REVERSIBLE DEFECT, BUT NOT MUCH ANGINA. ADVISE CONTINUE MEDICAL THERAPY.  Denies chest pain today.

## 2023-12-21 ENCOUNTER — Other Ambulatory Visit: Payer: Self-pay | Admitting: Cardiology

## 2023-12-21 DIAGNOSIS — K219 Gastro-esophageal reflux disease without esophagitis: Secondary | ICD-10-CM

## 2023-12-22 ENCOUNTER — Ambulatory Visit (INDEPENDENT_AMBULATORY_CARE_PROVIDER_SITE_OTHER): Admitting: Cardiology

## 2023-12-22 ENCOUNTER — Encounter: Payer: Self-pay | Admitting: Cardiology

## 2023-12-22 VITALS — BP 122/68 | HR 69 | Temp 97.7°F | Ht 74.0 in | Wt 221.0 lb

## 2023-12-22 DIAGNOSIS — J0140 Acute pansinusitis, unspecified: Secondary | ICD-10-CM | POA: Insufficient documentation

## 2023-12-22 DIAGNOSIS — Z0130 Encounter for examination of blood pressure without abnormal findings: Secondary | ICD-10-CM

## 2023-12-22 MED ORDER — AMOXICILLIN-POT CLAVULANATE 875-125 MG PO TABS: 1.0000 | ORAL_TABLET | Freq: Two times a day (BID) | ORAL | 0 refills | Status: AC

## 2023-12-22 MED ORDER — FLUTICASONE PROPIONATE 50 MCG/ACT NA SUSP
1.0000 | Freq: Every day | NASAL | 2 refills | Status: DC
Start: 2023-12-22 — End: 2024-04-18

## 2023-12-22 NOTE — Progress Notes (Signed)
 Established Patient Office Visit  Subjective:  Patient ID: Jesse May, male    DOB: August 04, 1956  Age: 67 y.o. MRN: [id number]  Chief Complaint  Patient presents with   Acute Visit    Sinus Infection, R Sided Headache, Sinus Pressure & Cough    Patient in office for an acute visit, complaining of right sided headache, sinus pressure, cough with yellow mucous production. Patient states he tried over the counter sinus relief with no change in symptoms. Will send in Augmentin and Flonase. Increase fluid intake.   Sinus Problem This is a new problem. The current episode started in the past 7 days. The problem has been gradually worsening since onset. There has been no fever. The pain is mild. Associated symptoms include congestion, coughing, headaches, sinus pressure, sneezing and a sore throat. Pertinent negatives include no shortness of breath. Treatments tried: Sinus medicine. The treatment provided no relief.    No other concerns at this time.   Past Medical History:  Diagnosis Date   MI (myocardial infarction) [facility])     Past Surgical History:  Procedure Laterality Date   APPENDECTOMY     CARDIAC CATHETERIZATION  2015   stent left circumflex-per Dr. Leonard Schwartz. Gwen Pounds note Care Everywhere   CHOLECYSTECTOMY     CORONARY BALLOON ANGIOPLASTY N/A 03/26/2023   Procedure: CORONARY BALLOON ANGIOPLASTY;  Surgeon: Marykay Lex, MD;  Location: [facility] INVASIVE CV LAB;  Service: Cardiovascular;  Laterality: N/A;   LEFT HEART CATH AND CORONARY ANGIOGRAPHY Left 03/26/2023   Procedure: LEFT HEART CATH AND CORONARY ANGIOGRAPHY;  Surgeon: Laurier Nancy, MD;  Location: [facility] INVASIVE CV LAB;  Service: Cardiovascular;  Laterality: Left;   TONSILLECTOMY      Social History   Socioeconomic History   Marital status: Divorced    Spouse name: Not on file   Number of children: Not on file   Years of education: Not on file   Highest education level: Not on file  Occupational History   Not on file   Tobacco Use   Smoking status: Never   Smokeless tobacco: Never  Vaping Use   Vaping status: Never Used  Substance and Sexual Activity   Alcohol use: Not Currently   Drug use: Not Currently   Sexual activity: Not on file  Other Topics Concern   Not on file  Social History Narrative   Lives alone.  [name], here today. Has pet, dog.    Social Drivers of Corporate investment banker Strain: Not on file  Food Insecurity: Not on file  Transportation Needs: Not on file  Physical Activity: Not on file  Stress: Not on file  Social Connections: Not on file  Intimate Partner Violence: Not on file    History reviewed. No pertinent family history.  No Known Allergies  Outpatient Medications Prior to Visit  Medication Sig   amLODipine (NORVASC) 10 MG tablet Take 1 tablet (10 mg total) by mouth daily.   Aspirin 81 MG CAPS Aspirin 81 MG Oral Capsule QTY: 0 capsule Days: 0 Refills: 0  Written: 02/07/21 Patient Instructions:   chlorthalidone (HYGROTON) 25 MG tablet Take 1 tablet (25 mg total) by mouth daily.   clopidogrel (PLAVIX) 75 MG tablet Take 1 tablet (75 mg total) by mouth daily.   esomeprazole (NEXIUM) 40 MG capsule Take 1 capsule by mouth once daily   furosemide (LASIX) 20 MG tablet Take 1 tablet (20 mg total) by mouth daily.   isosorbide mononitrate (IMDUR) 30 MG 24  hr tablet Take 1 tablet by mouth once daily   levothyroxine (SYNTHROID) 137 MCG tablet Take 1 tablet (137 mcg total) by mouth daily before breakfast.   losartan (COZAAR) 100 MG tablet Take 1 tablet (100 mg total) by mouth daily.   nitroGLYCERIN (NITROSTAT) 0.4 MG SL tablet Place 1 tablet (0.4 mg total) under the tongue every 5 (five) minutes as needed for chest pain.   potassium chloride (KLOR-CON) 10 MEQ tablet Take 1 tablet by mouth twice daily   rosuvastatin (CRESTOR) 40 MG tablet Take 40 mg by mouth daily.   traZODone (DESYREL) 100 MG tablet TAKE 1 TABLET BY MOUTH NIGHTLY AT BEDTIME AS NEEDED FOR  SLEEP (NOTE  INCREASED  DOSAGE)   No facility-administered medications prior to visit.    Review of Systems  Constitutional: Negative.   HENT:  Positive for congestion, sinus pressure, sinus pain, sneezing and sore throat.   Eyes: Negative.   Respiratory:  Positive for cough. Negative for shortness of breath.   Cardiovascular: Negative.  Negative for chest pain.  Gastrointestinal: Negative.  Negative for abdominal pain, constipation and diarrhea.  Genitourinary: Negative.   Musculoskeletal:  Negative for joint pain and myalgias.  Skin: Negative.   Neurological:  Positive for headaches. Negative for dizziness.  Endo/Heme/Allergies: Negative.   Psychiatric/Behavioral:  Negative for depression. The patient is not nervous/anxious.   All other systems reviewed and are negative.      Objective:   BP 122/68   Pulse 69   Temp 97.7 F (36.5 C) (Tympanic)   Ht 6\' 2"  (1.88 m)   Wt 221 lb (100.2 kg)   SpO2 96%   BMI 28.37 kg/m   Vitals:   12/22/23 0939  BP: 122/68  Pulse: 69  Temp: 97.7 F (36.5 C)  Height: 6\' 2"  (1.88 m)  Weight: 221 lb (100.2 kg)  SpO2: 96%  TempSrc: Tympanic  BMI (Calculated): 28.36    Physical Exam Nursing note reviewed.  Constitutional:      Appearance: Normal appearance. He is normal weight.  HENT:     Head: Normocephalic and atraumatic.     Nose: Nose normal.     Mouth/Throat:     Mouth: Mucous membranes are moist.     Pharynx: Oropharynx is clear.  Eyes:     Extraocular Movements: Extraocular movements intact.     Conjunctiva/sclera: Conjunctivae normal.     Pupils: Pupils are equal, round, and reactive to light.  Cardiovascular:     Rate and Rhythm: Normal rate and regular rhythm.     Pulses: Normal pulses.     Heart sounds: Normal heart sounds.  Pulmonary:     Effort: Pulmonary effort is normal.     Breath sounds: Normal breath sounds.  Abdominal:     General: Abdomen is flat. Bowel sounds are normal.     Palpations: Abdomen is  soft.  Musculoskeletal:        General: Normal range of motion.     Cervical back: Normal range of motion.  Skin:    General: Skin is warm and dry.  Neurological:     General: No focal deficit present.     Mental Status: He is alert and oriented to person, place, and time.  Psychiatric:        Mood and Affect: Mood normal.        Behavior: Behavior normal.        Thought Content: Thought content normal.        Judgment: Judgment normal.  No results found for any visits on 12/22/23.  No results found for this or any previous visit (from the past 2160 hours).    Assessment & Plan:  Augmentin Flonase Increase fluid intake  Problem List Items Addressed This Visit       Respiratory   Acute non-recurrent pansinusitis - Primary   Relevant Medications   amoxicillin-clavulanate (AUGMENTIN) 875-125 MG tablet   fluticasone (FLONASE) 50 MCG/ACT nasal spray    Return if symptoms worsen or fail to improve.   Total time spent: 25 minutes  Google, NP  12/22/2023   This document may have been prepared by Dragon Voice Recognition software and as such may include unintentional dictation errors.

## 2024-01-08 ENCOUNTER — Ambulatory Visit: Payer: Managed Care, Other (non HMO) | Admitting: Cardiology

## 2024-01-26 ENCOUNTER — Other Ambulatory Visit: Payer: Self-pay | Admitting: Cardiovascular Disease

## 2024-01-26 DIAGNOSIS — I209 Angina pectoris, unspecified: Secondary | ICD-10-CM

## 2024-01-26 DIAGNOSIS — I25119 Atherosclerotic heart disease of native coronary artery with unspecified angina pectoris: Secondary | ICD-10-CM

## 2024-01-26 DIAGNOSIS — R0602 Shortness of breath: Secondary | ICD-10-CM

## 2024-01-26 DIAGNOSIS — I10 Essential (primary) hypertension: Secondary | ICD-10-CM

## 2024-01-26 DIAGNOSIS — I25118 Atherosclerotic heart disease of native coronary artery with other forms of angina pectoris: Secondary | ICD-10-CM

## 2024-01-26 DIAGNOSIS — E782 Mixed hyperlipidemia: Secondary | ICD-10-CM

## 2024-02-05 ENCOUNTER — Ambulatory Visit: Payer: Managed Care, Other (non HMO) | Admitting: Cardiology

## 2024-02-08 ENCOUNTER — Ambulatory Visit: Admitting: Cardiology

## 2024-02-09 ENCOUNTER — Other Ambulatory Visit

## 2024-02-09 DIAGNOSIS — E782 Mixed hyperlipidemia: Secondary | ICD-10-CM

## 2024-02-09 DIAGNOSIS — E039 Hypothyroidism, unspecified: Secondary | ICD-10-CM

## 2024-02-09 DIAGNOSIS — R7301 Impaired fasting glucose: Secondary | ICD-10-CM

## 2024-02-09 DIAGNOSIS — I10 Essential (primary) hypertension: Secondary | ICD-10-CM

## 2024-02-10 LAB — CMP14+EGFR
ALT: 20 IU/L (ref 0–44)
AST: 17 IU/L (ref 0–40)
Albumin: 4.4 g/dL (ref 3.9–4.9)
Alkaline Phosphatase: 56 IU/L (ref 44–121)
BUN/Creatinine Ratio: 13 (ref 10–24)
BUN: 16 mg/dL (ref 8–27)
Bilirubin Total: 0.8 mg/dL (ref 0.0–1.2)
CO2: 25 mmol/L (ref 20–29)
Calcium: 9.1 mg/dL (ref 8.6–10.2)
Chloride: 103 mmol/L (ref 96–106)
Creatinine, Ser: 1.21 mg/dL (ref 0.76–1.27)
Globulin, Total: 1.6 g/dL (ref 1.5–4.5)
Glucose: 87 mg/dL (ref 70–99)
Potassium: 4 mmol/L (ref 3.5–5.2)
Sodium: 141 mmol/L (ref 134–144)
Total Protein: 6 g/dL (ref 6.0–8.5)
eGFR: 65 mL/min/{1.73_m2} (ref >59)

## 2024-02-10 LAB — LIPID PANEL
Chol/HDL Ratio: 3.3 ratio (ref 0.0–5.0)
Cholesterol, Total: 118 mg/dL (ref 100–199)
HDL: 36 mg/dL — ABNORMAL LOW (ref >39)
LDL Chol Calc (NIH): 69 mg/dL (ref 0–99)
Triglycerides: 58 mg/dL (ref 0–149)
VLDL Cholesterol Cal: 13 mg/dL (ref 5–40)

## 2024-02-10 LAB — HEMOGLOBIN A1C
Est. average glucose Bld gHb Est-mCnc: 111 mg/dL
Hgb A1c MFr Bld: 5.5 % (ref 4.8–5.6)

## 2024-02-10 LAB — TSH: TSH: 5.84 u[IU]/mL — ABNORMAL HIGH (ref 0.450–4.500)

## 2024-02-11 ENCOUNTER — Ambulatory Visit: Admitting: Cardiology

## 2024-02-11 ENCOUNTER — Encounter: Payer: Self-pay | Admitting: Cardiology

## 2024-02-11 VITALS — BP 148/78 | HR 70 | Ht 74.0 in | Wt 225.0 lb

## 2024-02-11 DIAGNOSIS — E782 Mixed hyperlipidemia: Secondary | ICD-10-CM

## 2024-02-11 DIAGNOSIS — I10 Essential (primary) hypertension: Secondary | ICD-10-CM

## 2024-02-11 DIAGNOSIS — E039 Hypothyroidism, unspecified: Secondary | ICD-10-CM

## 2024-02-11 DIAGNOSIS — R5383 Other fatigue: Secondary | ICD-10-CM | POA: Diagnosis not present

## 2024-02-11 NOTE — Progress Notes (Signed)
 Established Patient Office Visit  Subjective:  Patient ID: Jesse May, male    DOB: Feb 26, 1956  Age: 68 y.o. MRN: [id number]  Chief Complaint  Patient presents with   Follow-up    4 Months Follow Up    Patient in office for 4 month follow up, discuss recent lab results. Patient reports felling well over all. Complains of generalized fatigue. Will add a vitamin D level and B12 level to recent lab work. TSH was abnormal, will recheck in 4 weeks. Patient also requesting his testosterone be checked. Will check that in 4 weeks with TSH.  Patient reports he continues to have intermittent watery eyes. Was previously instructed to start an antihistamine, patient states it does not help. Recommend getting an OTC eye drop that helps with allergies.  Patient reports a recent injury to his lower back. Has tried a heating pad and ice with no relief. Recommend getting Voltaren gel, handout of back exercises given to patient.  Monitor blood pressure at home,schedule follow up with cardiology.     No other concerns at this time.   Past Medical History:  Diagnosis Date   MI (myocardial infarction) [facility])     Past Surgical History:  Procedure Laterality Date   APPENDECTOMY     CARDIAC CATHETERIZATION  2015   stent left circumflex-per Dr. Geraldene Kleine. Bary Likes note Care Everywhere   CHOLECYSTECTOMY     CORONARY BALLOON ANGIOPLASTY N/A 03/26/2023   Procedure: CORONARY BALLOON ANGIOPLASTY;  Surgeon: Arleen Lacer, MD;  Location: [facility] INVASIVE CV LAB;  Service: Cardiovascular;  Laterality: N/A;   LEFT HEART CATH AND CORONARY ANGIOGRAPHY Left 03/26/2023   Procedure: LEFT HEART CATH AND CORONARY ANGIOGRAPHY;  Surgeon: Cherrie Cornwall, MD;  Location: [facility] INVASIVE CV LAB;  Service: Cardiovascular;  Laterality: Left;   TONSILLECTOMY      Social History   Socioeconomic History   Marital status: Divorced    Spouse name: Not on file   Number of children: Not on file   Years of education: Not on file    Highest education level: Not on file  Occupational History   Not on file  Tobacco Use   Smoking status: Never   Smokeless tobacco: Never  Vaping Use   Vaping status: Never Used  Substance and Sexual Activity   Alcohol use: Not Currently   Drug use: Not Currently   Sexual activity: Not on file  Other Topics Concern   Not on file  Social History Narrative   Lives alone.  Glori Lard, here today. Has pet, dog.    Social Drivers of Corporate investment banker Strain: Not on file  Food Insecurity: Not on file  Transportation Needs: Not on file  Physical Activity: Not on file  Stress: Not on file  Social Connections: Not on file  Intimate Partner Violence: Not on file    History reviewed. No pertinent family history.  No Known Allergies  Outpatient Medications Prior to Visit  Medication Sig   amLODipine  (NORVASC ) 10 MG tablet Take 1 tablet (10 mg total) by mouth daily.   Aspirin  81 MG CAPS Aspirin  81 MG Oral Capsule QTY: 0 capsule Days: 0 Refills: 0  Written: 02/07/21 Patient Instructions:   chlorthalidone  (HYGROTON ) 25 MG tablet Take 1 tablet (25 mg total) by mouth daily.   clopidogrel  (PLAVIX ) 75 MG tablet Take 1 tablet (75 mg total) by mouth daily.   esomeprazole  (NEXIUM ) 40 MG capsule Take 1 capsule by mouth once daily   fluticasone  (  FLONASE ) 50 MCG/ACT nasal spray Place 1 spray into both nostrils daily.   furosemide  (LASIX ) 20 MG tablet Take 1 tablet (20 mg total) by mouth daily.   isosorbide  mononitrate (IMDUR ) 30 MG 24 hr tablet Take 1 tablet by mouth once daily   levothyroxine  (SYNTHROID ) 137 MCG tablet Take 1 tablet (137 mcg total) by mouth daily before breakfast.   losartan  (COZAAR ) 100 MG tablet Take 1 tablet (100 mg total) by mouth daily.   nitroGLYCERIN  (NITROSTAT ) 0.4 MG SL tablet Place 1 tablet (0.4 mg total) under the tongue every 5 (five) minutes as needed for chest pain.   potassium chloride  (KLOR-CON ) 10 MEQ tablet Take 1 tablet by mouth twice daily    rosuvastatin  (CRESTOR ) 40 MG tablet Take 40 mg by mouth daily.   traZODone (DESYREL) 100 MG tablet TAKE 1 TABLET BY MOUTH NIGHTLY AT BEDTIME AS NEEDED FOR SLEEP (NOTE  INCREASED  DOSAGE)   No facility-administered medications prior to visit.    Review of Systems  Constitutional: Negative.   HENT: Negative.    Eyes: Negative.   Respiratory: Negative.  Negative for shortness of breath.   Cardiovascular: Negative.  Negative for chest pain.  Gastrointestinal: Negative.  Negative for abdominal pain, constipation and diarrhea.  Genitourinary: Negative.   Musculoskeletal:  Positive for back pain. Negative for joint pain and myalgias.  Skin: Negative.   Neurological: Negative.  Negative for dizziness and headaches.  Endo/Heme/Allergies: Negative.   All other systems reviewed and are negative.      Objective:   BP (!) 148/78 (BP Location: Right Arm, Patient Position: Sitting, Cuff Size: Large)   Pulse 70   Ht 6\' 2"  (1.88 m)   Wt 225 lb (102.1 kg)   SpO2 97%   BMI 28.89 kg/m   Vitals:   02/11/24 1306 02/11/24 1328  BP: (!) 162/74 (!) 148/78  Pulse: 70   Height: 6\' 2"  (1.88 m)   Weight: 225 lb (102.1 kg)   SpO2: 97%   BMI (Calculated): 28.88     Physical Exam Nursing note reviewed.  Constitutional:      Appearance: Normal appearance. He is normal weight.  HENT:     Head: Normocephalic and atraumatic.     Nose: Nose normal.     Mouth/Throat:     Mouth: Mucous membranes are moist.     Pharynx: Oropharynx is clear.  Eyes:     Extraocular Movements: Extraocular movements intact.     Conjunctiva/sclera: Conjunctivae normal.     Pupils: Pupils are equal, round, and reactive to light.  Cardiovascular:     Rate and Rhythm: Normal rate and regular rhythm.     Pulses: Normal pulses.     Heart sounds: Normal heart sounds.  Pulmonary:     Effort: Pulmonary effort is normal.     Breath sounds: Normal breath sounds.  Abdominal:     General: Abdomen is flat. Bowel sounds are  normal.     Palpations: Abdomen is soft.  Musculoskeletal:        General: Normal range of motion.     Cervical back: Normal range of motion.  Skin:    General: Skin is warm and dry.  Neurological:     General: No focal deficit present.     Mental Status: He is alert and oriented to person, place, and time.  Psychiatric:        Mood and Affect: Mood normal.        Behavior: Behavior normal.  Thought Content: Thought content normal.        Judgment: Judgment normal.      No results found for any visits on 02/11/24.  Recent Results (from the past 2160 hours)  Hemoglobin A1c     Status: None   Collection Time: 02/09/24 12:17 PM  Result Value Ref Range   Hgb A1c MFr Bld 5.5 4.8 - 5.6 %    Comment:          Prediabetes: 5.7 - 6.4          Diabetes: >6.4          Glycemic control for adults with diabetes: <7.0    Est. average glucose Bld gHb Est-mCnc 111 mg/dL  TSH     Status: Abnormal   Collection Time: 02/09/24 12:17 PM  Result Value Ref Range   TSH 5.840 (H) 0.450 - 4.500 uIU/mL  CMP14+EGFR     Status: None   Collection Time: 02/09/24 12:17 PM  Result Value Ref Range   Glucose 87 70 - 99 mg/dL   BUN 16 8 - 27 mg/dL   Creatinine, Ser 2.59 0.76 - 1.27 mg/dL   eGFR 65 >56 LO/VFI/4.33   BUN/Creatinine Ratio 13 10 - 24   Sodium 141 134 - 144 mmol/L   Potassium 4.0 3.5 - 5.2 mmol/L   Chloride 103 96 - 106 mmol/L   CO2 25 20 - 29 mmol/L   Calcium  9.1 8.6 - 10.2 mg/dL   Total Protein 6.0 6.0 - 8.5 g/dL   Albumin 4.4 3.9 - 4.9 g/dL   Globulin, Total 1.6 1.5 - 4.5 g/dL   Bilirubin Total 0.8 0.0 - 1.2 mg/dL   Alkaline Phosphatase 56 44 - 121 IU/L   AST 17 0 - 40 IU/L   ALT 20 0 - 44 IU/L  Lipid panel     Status: Abnormal   Collection Time: 02/09/24 12:17 PM  Result Value Ref Range   Cholesterol, Total 118 100 - 199 mg/dL   Triglycerides 58 0 - 149 mg/dL   HDL 36 (L) >29 mg/dL   VLDL Cholesterol Cal 13 5 - 40 mg/dL   LDL Chol Calc (NIH) 69 0 - 99 mg/dL   Chol/HDL  Ratio 3.3 0.0 - 5.0 ratio    Comment:                                   T. Chol/HDL Ratio                                             Men  Women                               1/2 Avg.Risk  3.4    3.3                                   Avg.Risk  5.0    4.4                                2X Avg.Risk  9.6    7.1  3X Avg.Risk 23.4   11.0       Assessment & Plan:  Recheck TSH in 1 month.  Testosterone in 1 moth. Antihistamine eye drops Voltaren gel Lower back exercises Monitor blood pressure at home Schedule follow up with cardiology  Problem List Items Addressed This Visit       Cardiovascular and Mediastinum   Essential hypertension - Primary (Chronic)     Endocrine   Acquired hypothyroidism   Relevant Orders   TSH     Other   Hyperlipidemia, mixed (Chronic)   Other Visit Diagnoses       Other fatigue       Relevant Orders   Testosterone,Free and Total       Return in about 4 months (around 06/12/2024) for with fasting labs prior.   Total time spent: 25 minutes  Google, NP  02/11/2024   This document may have been prepared by Dragon Voice Recognition software and as such may include unintentional dictation errors.

## 2024-02-12 LAB — VITAMIN B12: Vitamin B-12: 344 pg/mL (ref 232–1245)

## 2024-02-12 LAB — VITAMIN D 25 HYDROXY (VIT D DEFICIENCY, FRACTURES): Vit D, 25-Hydroxy: 32.1 ng/mL (ref 30.0–100.0)

## 2024-02-12 LAB — SPECIMEN STATUS REPORT

## 2024-02-16 ENCOUNTER — Ambulatory Visit: Admitting: Cardiovascular Disease

## 2024-02-16 ENCOUNTER — Encounter: Payer: Self-pay | Admitting: Cardiovascular Disease

## 2024-02-16 VITALS — BP 168/62 | HR 70 | Ht 74.0 in | Wt 223.0 lb

## 2024-02-16 DIAGNOSIS — I10 Essential (primary) hypertension: Secondary | ICD-10-CM

## 2024-02-16 DIAGNOSIS — I25119 Atherosclerotic heart disease of native coronary artery with unspecified angina pectoris: Secondary | ICD-10-CM

## 2024-02-16 DIAGNOSIS — R0789 Other chest pain: Secondary | ICD-10-CM

## 2024-02-16 DIAGNOSIS — G4733 Obstructive sleep apnea (adult) (pediatric): Secondary | ICD-10-CM

## 2024-02-16 DIAGNOSIS — Z955 Presence of coronary angioplasty implant and graft: Secondary | ICD-10-CM

## 2024-02-16 MED ORDER — METOPROLOL SUCCINATE ER 50 MG PO TB24: 50.0000 mg | ORAL_TABLET | Freq: Every day | ORAL | 11 refills | Status: AC

## 2024-02-16 MED ORDER — ISOSORBIDE MONONITRATE ER 30 MG PO TB24: 30.0000 mg | ORAL_TABLET | Freq: Two times a day (BID) | ORAL | 1 refills | Status: DC

## 2024-02-16 NOTE — Progress Notes (Signed)
 Cardiology Office Note   Date:  02/16/2024   ID:  Jesse May, DOB Dec 26, 1955, MRN [id number]  PCP:  Alica Antu, NP  Cardiologist:  Debborah Fairly, MD      History of Present Illness: Jesse May is a 68 y.o. male who presents for  Chief Complaint  Patient presents with   Follow-up    Follow Up    Left sided chest pain associated with SOB, sporadic twice a week. No chest pain right now, mostly on exertion.  Chest Pain  This is a new problem. The current episode started 1 to 4 weeks ago. The onset quality is sudden. The problem has been waxing and waning. The pain is at a severity of 4/10. The quality of the pain is described as sharp. Associated symptoms include irregular heartbeat, malaise/fatigue and shortness of breath. Pertinent negatives include no diaphoresis, dizziness, nausea, orthopnea, palpitations or syncope.      Past Medical History:  Diagnosis Date   MI (myocardial infarction) [facility])      Past Surgical History:  Procedure Laterality Date   APPENDECTOMY     CARDIAC CATHETERIZATION  2015   stent left circumflex-per Dr. Geraldene Kleine. Bary Likes note Care Everywhere   CHOLECYSTECTOMY     CORONARY BALLOON ANGIOPLASTY N/A 03/26/2023   Procedure: CORONARY BALLOON ANGIOPLASTY;  Surgeon: Arleen Lacer, MD;  Location: [facility] INVASIVE CV LAB;  Service: Cardiovascular;  Laterality: N/A;   LEFT HEART CATH AND CORONARY ANGIOGRAPHY Left 03/26/2023   Procedure: LEFT HEART CATH AND CORONARY ANGIOGRAPHY;  Surgeon: Cherrie Cornwall, MD;  Location: [facility] INVASIVE CV LAB;  Service: Cardiovascular;  Laterality: Left;   TONSILLECTOMY       Current Outpatient Medications  Medication Sig Dispense Refill   isosorbide  mononitrate (IMDUR ) 30 MG 24 hr tablet Take 1 tablet (30 mg total) by mouth 2 (two) times daily. 60 tablet 1   metoprolol  succinate (TOPROL  XL) 50 MG 24 hr tablet Take 1 tablet (50 mg total) by mouth daily. Take with or immediately following a meal. 30 tablet 11   amLODipine   (NORVASC ) 10 MG tablet Take 1 tablet (10 mg total) by mouth daily. 30 tablet 11   Aspirin  81 MG CAPS Aspirin  81 MG Oral Capsule QTY: 0 capsule Days: 0 Refills: 0  Written: 02/07/21 Patient Instructions:     chlorthalidone  (HYGROTON ) 25 MG tablet Take 1 tablet (25 mg total) by mouth daily. 30 tablet 1   clopidogrel  (PLAVIX ) 75 MG tablet Take 1 tablet (75 mg total) by mouth daily. 30 tablet 11   esomeprazole  (NEXIUM ) 40 MG capsule Take 1 capsule by mouth once daily 60 capsule 0   fluticasone  (FLONASE ) 50 MCG/ACT nasal spray Place 1 spray into both nostrils daily. 15.8 mL 2   furosemide  (LASIX ) 20 MG tablet Take 1 tablet (20 mg total) by mouth daily. 30 tablet 11   isosorbide  mononitrate (IMDUR ) 30 MG 24 hr tablet Take 1 tablet by mouth once daily 60 tablet 0   levothyroxine  (SYNTHROID ) 137 MCG tablet Take 1 tablet (137 mcg total) by mouth daily before breakfast. 90 tablet 2   losartan  (COZAAR ) 100 MG tablet Take 1 tablet (100 mg total) by mouth daily. 30 tablet 11   nitroGLYCERIN  (NITROSTAT ) 0.4 MG SL tablet Place 1 tablet (0.4 mg total) under the tongue every 5 (five) minutes as needed for chest pain. 100 tablet 3   potassium chloride  (KLOR-CON ) 10 MEQ tablet Take 1 tablet by mouth twice daily 30 tablet 0  rosuvastatin  (CRESTOR ) 40 MG tablet Take 40 mg by mouth daily.     traZODone (DESYREL) 100 MG tablet TAKE 1 TABLET BY MOUTH NIGHTLY AT BEDTIME AS NEEDED FOR SLEEP (NOTE  INCREASED  DOSAGE) 90 tablet 0   No current facility-administered medications for this visit.    Allergies:   Patient has no known allergies.    Social History:   reports that he has never smoked. He has never used smokeless tobacco. He reports that he does not currently use alcohol. He reports that he does not currently use drugs.   Family History:  family history is not on file.    ROS:     Review of Systems  Constitutional:  Positive for malaise/fatigue. Negative for diaphoresis.  HENT: Negative.    Eyes: Negative.    Respiratory:  Positive for shortness of breath.   Cardiovascular:  Positive for chest pain. Negative for palpitations, orthopnea and syncope.  Gastrointestinal: Negative.  Negative for nausea.  Genitourinary: Negative.   Musculoskeletal: Negative.   Skin: Negative.   Neurological: Negative.  Negative for dizziness.  Endo/Heme/Allergies: Negative.   Psychiatric/Behavioral: Negative.    All other systems reviewed and are negative.     All other systems are reviewed and negative.    PHYSICAL EXAM: VS:  BP (!) 168/62   Pulse 70   Ht 6\' 2"  (1.88 m)   Wt 223 lb (101.2 kg)   SpO2 96%   BMI 28.63 kg/m  , BMI Body mass index is 28.63 kg/m. Last weight:  Wt Readings from Last 3 Encounters:  02/16/24 223 lb (101.2 kg)  02/11/24 225 lb (102.1 kg)  12/22/23 221 lb (100.2 kg)     Physical Exam Vitals reviewed.  Constitutional:      Appearance: Normal appearance. He is normal weight.  HENT:     Head: Normocephalic.     Nose: Nose normal.     Mouth/Throat:     Mouth: Mucous membranes are moist.  Eyes:     Pupils: Pupils are equal, round, and reactive to light.  Cardiovascular:     Rate and Rhythm: Normal rate and regular rhythm.     Pulses: Normal pulses.     Heart sounds: Normal heart sounds.  Pulmonary:     Effort: Pulmonary effort is normal.  Abdominal:     General: Abdomen is flat. Bowel sounds are normal.  Musculoskeletal:        General: Normal range of motion.     Cervical back: Normal range of motion.  Skin:    General: Skin is warm.  Neurological:     General: No focal deficit present.     Mental Status: He is alert.  Psychiatric:        Mood and Affect: Mood normal.       EKG:   Recent Labs: 03/27/2023: Hemoglobin 15.4; Platelets 179 02/09/2024: ALT 20; BUN 16; Creatinine, Ser 1.21; Potassium 4.0; Sodium 141; TSH 5.840    Lipid Panel    Component Value Date/Time   CHOL 118 02/09/2024 1217   TRIG 58 02/09/2024 1217   HDL 36 (L) 02/09/2024 1217    CHOLHDL 3.3 02/09/2024 1217   LDLCALC 69 02/09/2024 1217      Other studies Reviewed: Additional studies/ records that were reviewed today include:  Review of the above records demonstrates:       No data to display            ASSESSMENT AND PLAN:    ICD-10-CM  1. Coronary artery disease involving native coronary artery of native heart with angina pectoris (HCC)  I25.119 metoprolol  succinate (TOPROL  XL) 50 MG 24 hr tablet    PCV ECHOCARDIOGRAM COMPLETE    MYOCARDIAL PERFUSION IMAGING    isosorbide  mononitrate (IMDUR ) 30 MG 24 hr tablet    2. Other chest pain  R07.89 metoprolol  succinate (TOPROL  XL) 50 MG 24 hr tablet    PCV ECHOCARDIOGRAM COMPLETE    MYOCARDIAL PERFUSION IMAGING    isosorbide  mononitrate (IMDUR ) 30 MG 24 hr tablet   echo, stress test, change imdur  30 bid, add metoprolol  50    3. Obstructive sleep apnea  G47.33 metoprolol  succinate (TOPROL  XL) 50 MG 24 hr tablet    PCV ECHOCARDIOGRAM COMPLETE    MYOCARDIAL PERFUSION IMAGING    isosorbide  mononitrate (IMDUR ) 30 MG 24 hr tablet    4. 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 Benign essential HTN  I10 metoprolol  succinate (TOPROL  XL) 50 MG 24 hr tablet    PCV ECHOCARDIOGRAM COMPLETE    MYOCARDIAL PERFUSION IMAGING    isosorbide  mononitrate (IMDUR ) 30 MG 24 hr tablet       Problem List Items Addressed This Visit       Cardiovascular and Mediastinum   Coronary artery disease - Primary   Relevant Medications   metoprolol  succinate (TOPROL  XL) 50 MG 24 hr tablet   isosorbide  mononitrate (IMDUR ) 30 MG 24 hr tablet   Other Relevant Orders   PCV ECHOCARDIOGRAM COMPLETE   MYOCARDIAL PERFUSION IMAGING     Respiratory   Obstructive sleep apnea   Relevant Medications   metoprolol  succinate (TOPROL   XL) 50 MG 24 hr tablet   isosorbide  mononitrate (IMDUR ) 30 MG 24 hr tablet   Other Relevant Orders   PCV ECHOCARDIOGRAM COMPLETE   MYOCARDIAL PERFUSION IMAGING     Other   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   Relevant Medications   metoprolol  succinate (TOPROL  XL) 50 MG 24 hr tablet   isosorbide  mononitrate (IMDUR ) 30 MG 24 hr tablet   Other Relevant Orders   PCV ECHOCARDIOGRAM COMPLETE   MYOCARDIAL PERFUSION IMAGING   Other Visit Diagnoses       Other chest pain       echo, stress test, change imdur  30 bid, add metoprolol  50   Relevant Medications   metoprolol  succinate (TOPROL  XL) 50 MG 24 hr tablet   isosorbide  mononitrate (IMDUR ) 30 MG 24 hr tablet   Other Relevant Orders   PCV ECHOCARDIOGRAM COMPLETE   MYOCARDIAL PERFUSION IMAGING     Benign essential HTN       Relevant Medications   metoprolol  succinate (TOPROL  XL) 50 MG 24 hr tablet   isosorbide  mononitrate (IMDUR ) 30 MG 24 hr tablet   Other Relevant Orders   PCV ECHOCARDIOGRAM COMPLETE   MYOCARDIAL PERFUSION IMAGING          Disposition:   Return in about 3 weeks (around 03/08/2024) for echo, stress test and f/u.    Total time spent: 40 minutes  Signed,  Debborah Fairly, MD  02/16/2024 1:38 PM    [facility]

## 2024-02-18 ENCOUNTER — Ambulatory Visit: Admitting: Cardiology

## 2024-02-22 ENCOUNTER — Encounter: Payer: Self-pay | Admitting: Cardiology

## 2024-02-22 ENCOUNTER — Ambulatory Visit (INDEPENDENT_AMBULATORY_CARE_PROVIDER_SITE_OTHER): Admitting: Cardiology

## 2024-02-22 VITALS — BP 136/62 | HR 73 | Ht 74.0 in | Wt 224.0 lb

## 2024-02-22 DIAGNOSIS — M545 Low back pain, unspecified: Secondary | ICD-10-CM | POA: Diagnosis not present

## 2024-02-22 DIAGNOSIS — I10 Essential (primary) hypertension: Secondary | ICD-10-CM

## 2024-02-22 MED ORDER — METHYLPREDNISOLONE 4 MG PO TBPK: ORAL_TABLET | ORAL | 0 refills | Status: DC

## 2024-02-22 MED ORDER — KETOROLAC TROMETHAMINE 10 MG PO TABS: 10.0000 mg | ORAL_TABLET | Freq: Four times a day (QID) | ORAL | 0 refills | Status: AC | PRN

## 2024-02-22 NOTE — Progress Notes (Signed)
 Established Patient Office Visit  Subjective:  Patient ID: Jesse May, male    DOB: 05-26-1956  Age: 68 y.o. MRN: [id number]  Chief Complaint  Patient presents with   Acute Visit    Back Pain, Left Side Sciatic Pain    Patient in office for an acute visit, complaining of back pain, left side, sciatic pain. Patient reports experiencing lumbar back pain off and on for the past 2-3 years. Patient reported back pain at 02/11/24 ov, was given a hand out on stretches for back pain, patient admits to not doing the stretches. Patient reports today, pain is worse. Recommend doing back stretches. Will send in a medrol dose pack and Toradol. Patient has been taking aspirin  1,000 mg multiple times a day, states ibuprofen  does not help. If pain continues will order a CT scan of the back. Patient reports doing PT years ago with no relief.   Back Pain This is a chronic problem. The current episode started 1 to 4 weeks ago. The problem has been waxing and waning since onset. The pain is present in the lumbar spine and gluteal. The quality of the pain is described as aching. The pain radiates to the left thigh. The pain is moderate. The symptoms are aggravated by standing. Associated symptoms include leg pain and numbness. Pertinent negatives include no abdominal pain, bowel incontinence, chest pain, headaches or tingling. He has tried analgesics for the symptoms. The treatment provided no relief.    No other concerns at this time.   Past Medical History:  Diagnosis Date   MI (myocardial infarction) [facility])     Past Surgical History:  Procedure Laterality Date   APPENDECTOMY     CARDIAC CATHETERIZATION  2015   stent left circumflex-per Dr. Geraldene Kleine. Bary Likes note Care Everywhere   CHOLECYSTECTOMY     CORONARY BALLOON ANGIOPLASTY N/A 03/26/2023   Procedure: CORONARY BALLOON ANGIOPLASTY;  Surgeon: Arleen Lacer, MD;  Location: [facility] INVASIVE CV LAB;  Service: Cardiovascular;  Laterality: N/A;   LEFT HEART  CATH AND CORONARY ANGIOGRAPHY Left 03/26/2023   Procedure: LEFT HEART CATH AND CORONARY ANGIOGRAPHY;  Surgeon: Cherrie Cornwall, MD;  Location: [facility] INVASIVE CV LAB;  Service: Cardiovascular;  Laterality: Left;   TONSILLECTOMY      Social History   Socioeconomic History   Marital status: Divorced    Spouse name: Not on file   Number of children: Not on file   Years of education: Not on file   Highest education level: Not on file  Occupational History   Not on file  Tobacco Use   Smoking status: Never   Smokeless tobacco: Never  Vaping Use   Vaping status: Never Used  Substance and Sexual Activity   Alcohol use: Not Currently   Drug use: Not Currently   Sexual activity: Not on file  Other Topics Concern   Not on file  Social History Narrative   Lives alone.  Glori Lard, here today. Has pet, dog.    Social Drivers of Corporate investment banker Strain: Not on file  Food Insecurity: Not on file  Transportation Needs: Not on file  Physical Activity: Not on file  Stress: Not on file  Social Connections: Not on file  Intimate Partner Violence: Not on file    History reviewed. No pertinent family history.  No Known Allergies  Outpatient Medications Prior to Visit  Medication Sig   amLODipine  (NORVASC ) 10 MG tablet Take 1 tablet (10 mg total) by mouth  daily.   Aspirin  81 MG CAPS Aspirin  81 MG Oral Capsule QTY: 0 capsule Days: 0 Refills: 0  Written: 02/07/21 Patient Instructions:   chlorthalidone  (HYGROTON ) 25 MG tablet Take 1 tablet (25 mg total) by mouth daily.   clopidogrel  (PLAVIX ) 75 MG tablet Take 1 tablet (75 mg total) by mouth daily.   esomeprazole  (NEXIUM ) 40 MG capsule Take 1 capsule by mouth once daily   fluticasone  (FLONASE ) 50 MCG/ACT nasal spray Place 1 spray into both nostrils daily.   furosemide  (LASIX ) 20 MG tablet Take 1 tablet (20 mg total) by mouth daily.   isosorbide  mononitrate (IMDUR ) 30 MG 24 hr tablet Take 1 tablet by mouth once daily    isosorbide  mononitrate (IMDUR ) 30 MG 24 hr tablet Take 1 tablet (30 mg total) by mouth 2 (two) times daily.   levothyroxine  (SYNTHROID ) 137 MCG tablet Take 1 tablet (137 mcg total) by mouth daily before breakfast.   losartan  (COZAAR ) 100 MG tablet Take 1 tablet (100 mg total) by mouth daily.   metoprolol  succinate (TOPROL  XL) 50 MG 24 hr tablet Take 1 tablet (50 mg total) by mouth daily. Take with or immediately following a meal.   nitroGLYCERIN  (NITROSTAT ) 0.4 MG SL tablet Place 1 tablet (0.4 mg total) under the tongue every 5 (five) minutes as needed for chest pain.   potassium chloride  (KLOR-CON ) 10 MEQ tablet Take 1 tablet by mouth twice daily   rosuvastatin  (CRESTOR ) 40 MG tablet Take 40 mg by mouth daily.   traZODone (DESYREL) 100 MG tablet TAKE 1 TABLET BY MOUTH NIGHTLY AT BEDTIME AS NEEDED FOR SLEEP (NOTE  INCREASED  DOSAGE)   No facility-administered medications prior to visit.    Review of Systems  Constitutional: Negative.   HENT: Negative.    Eyes: Negative.   Respiratory: Negative.  Negative for shortness of breath.   Cardiovascular: Negative.  Negative for chest pain.  Gastrointestinal: Negative.  Negative for abdominal pain, bowel incontinence, constipation and diarrhea.  Genitourinary: Negative.   Musculoskeletal:  Positive for back pain. Negative for joint pain and myalgias.  Skin: Negative.   Neurological:  Positive for numbness. Negative for dizziness, tingling and headaches.  Endo/Heme/Allergies: Negative.   All other systems reviewed and are negative.      Objective:   BP 136/62   Pulse 73   Ht 6\' 2"  (1.88 m)   Wt 224 lb (101.6 kg)   SpO2 96%   BMI 28.76 kg/m   Vitals:   02/22/24 1306  BP: 136/62  Pulse: 73  Height: 6\' 2"  (1.88 m)  Weight: 224 lb (101.6 kg)  SpO2: 96%  BMI (Calculated): 28.75    Physical Exam Nursing note reviewed.  Constitutional:      Appearance: Normal appearance. He is normal weight.  HENT:     Head: Normocephalic and  atraumatic.     Nose: Nose normal.     Mouth/Throat:     Mouth: Mucous membranes are moist.     Pharynx: Oropharynx is clear.  Eyes:     Extraocular Movements: Extraocular movements intact.     Conjunctiva/sclera: Conjunctivae normal.     Pupils: Pupils are equal, round, and reactive to light.  Cardiovascular:     Rate and Rhythm: Normal rate and regular rhythm.     Pulses: Normal pulses.     Heart sounds: Normal heart sounds.  Pulmonary:     Effort: Pulmonary effort is normal.     Breath sounds: Normal breath sounds.  Abdominal:  General: Abdomen is flat. Bowel sounds are normal.     Palpations: Abdomen is soft.  Musculoskeletal:        General: Normal range of motion.     Cervical back: Normal range of motion.  Skin:    General: Skin is warm and dry.  Neurological:     General: No focal deficit present.     Mental Status: He is alert and oriented to person, place, and time.  Psychiatric:        Mood and Affect: Mood normal.        Behavior: Behavior normal.        Thought Content: Thought content normal.        Judgment: Judgment normal.      No results found for any visits on 02/22/24.  Recent Results (from the past 2160 hours)  Hemoglobin A1c     Status: None   Collection Time: 02/09/24 12:17 PM  Result Value Ref Range   Hgb A1c MFr Bld 5.5 4.8 - 5.6 %    Comment:          Prediabetes: 5.7 - 6.4          Diabetes: >6.4          Glycemic control for adults with diabetes: <7.0    Est. average glucose Bld gHb Est-mCnc 111 mg/dL  TSH     Status: Abnormal   Collection Time: 02/09/24 12:17 PM  Result Value Ref Range   TSH 5.840 (H) 0.450 - 4.500 uIU/mL  CMP14+EGFR     Status: None   Collection Time: 02/09/24 12:17 PM  Result Value Ref Range   Glucose 87 70 - 99 mg/dL   BUN 16 8 - 27 mg/dL   Creatinine, Ser 1.61 0.76 - 1.27 mg/dL   eGFR 65 >09 UE/AVW/0.98   BUN/Creatinine Ratio 13 10 - 24   Sodium 141 134 - 144 mmol/L   Potassium 4.0 3.5 - 5.2 mmol/L    Chloride 103 96 - 106 mmol/L   CO2 25 20 - 29 mmol/L   Calcium  9.1 8.6 - 10.2 mg/dL   Total Protein 6.0 6.0 - 8.5 g/dL   Albumin 4.4 3.9 - 4.9 g/dL   Globulin, Total 1.6 1.5 - 4.5 g/dL   Bilirubin Total 0.8 0.0 - 1.2 mg/dL   Alkaline Phosphatase 56 44 - 121 IU/L   AST 17 0 - 40 IU/L   ALT 20 0 - 44 IU/L  Lipid panel     Status: Abnormal   Collection Time: 02/09/24 12:17 PM  Result Value Ref Range   Cholesterol, Total 118 100 - 199 mg/dL   Triglycerides 58 0 - 149 mg/dL   HDL 36 (L) >11 mg/dL   VLDL Cholesterol Cal 13 5 - 40 mg/dL   LDL Chol Calc (NIH) 69 0 - 99 mg/dL   Chol/HDL Ratio 3.3 0.0 - 5.0 ratio    Comment:                                   T. Chol/HDL Ratio                                             Men  Women  1/2 Avg.Risk  3.4    3.3                                   Avg.Risk  5.0    4.4                                2X Avg.Risk  9.6    7.1                                3X Avg.Risk 23.4   11.0   VITAMIN D  25 Hydroxy (Vit-D Deficiency, Fractures)     Status: None   Collection Time: 02/09/24 12:17 PM  Result Value Ref Range   Vit D, 25-Hydroxy 32.1 30.0 - 100.0 ng/mL    Comment: Vitamin D  deficiency has been defined by the Institute of Medicine and an Endocrine Society practice guideline as a level of serum 25-OH vitamin D  less than 20 ng/mL (1,2). The Endocrine Society went on to further define vitamin D  insufficiency as a level between 21 and 29 ng/mL (2). 1. IOM (Institute of Medicine). 2010. Dietary reference    intakes for calcium  and D. [place]: The    [employer]. 2. Holick MF, Binkley [place], Bischoff-Ferrari HA, et al.    Evaluation, treatment, and prevention of vitamin D     deficiency: an Endocrine Society clinical practice    guideline. JCEM. 2011 Jul; 96(7):1911-30.   Vitamin B12     Status: None   Collection Time: 02/09/24 12:17 PM  Result Value Ref Range   Vitamin B-12 344 232 - 1,245 pg/mL  Specimen  status report     Status: None   Collection Time: 02/09/24 12:17 PM  Result Value Ref Range   specimen status report Comment     Comment: Written Authorization Written Authorization Written Authorization Received. Authorization received from Clell Trahan 02-11-2024 Logged by Rosalie Colony       Assessment & Plan:  Back stretches Medrol dose pack Toradol for pain  Problem List Items Addressed This Visit       Other   Lumbar back pain - Primary   Relevant Medications   methylPREDNISolone (MEDROL DOSEPAK) 4 MG TBPK tablet   ketorolac (TORADOL) 10 MG tablet    Return if symptoms worsen or fail to improve.   Total time spent: 25 minutes  [employer], NP  02/22/2024   This document may have been prepared by Dragon Voice Recognition software and as such may include unintentional dictation errors.

## 2024-02-25 ENCOUNTER — Ambulatory Visit: Admitting: Cardiology

## 2024-03-03 ENCOUNTER — Ambulatory Visit

## 2024-03-03 DIAGNOSIS — R0789 Other chest pain: Secondary | ICD-10-CM | POA: Diagnosis not present

## 2024-03-03 DIAGNOSIS — I25119 Atherosclerotic heart disease of native coronary artery with unspecified angina pectoris: Secondary | ICD-10-CM | POA: Diagnosis not present

## 2024-03-03 DIAGNOSIS — G4733 Obstructive sleep apnea (adult) (pediatric): Secondary | ICD-10-CM

## 2024-03-03 DIAGNOSIS — I10 Essential (primary) hypertension: Secondary | ICD-10-CM

## 2024-03-03 DIAGNOSIS — Z955 Presence of coronary angioplasty implant and graft: Secondary | ICD-10-CM | POA: Diagnosis not present

## 2024-03-04 ENCOUNTER — Other Ambulatory Visit: Payer: Self-pay | Admitting: Cardiology

## 2024-03-18 ENCOUNTER — Other Ambulatory Visit

## 2024-03-22 ENCOUNTER — Ambulatory Visit: Admitting: Cardiovascular Disease

## 2024-03-25 ENCOUNTER — Ambulatory Visit: Admitting: Cardiovascular Disease

## 2024-04-08 ENCOUNTER — Ambulatory Visit

## 2024-04-08 DIAGNOSIS — G4733 Obstructive sleep apnea (adult) (pediatric): Secondary | ICD-10-CM

## 2024-04-08 DIAGNOSIS — I371 Nonrheumatic pulmonary valve insufficiency: Secondary | ICD-10-CM | POA: Diagnosis not present

## 2024-04-08 DIAGNOSIS — I25119 Atherosclerotic heart disease of native coronary artery with unspecified angina pectoris: Secondary | ICD-10-CM

## 2024-04-08 DIAGNOSIS — I361 Nonrheumatic tricuspid (valve) insufficiency: Secondary | ICD-10-CM

## 2024-04-08 DIAGNOSIS — Z955 Presence of coronary angioplasty implant and graft: Secondary | ICD-10-CM

## 2024-04-08 DIAGNOSIS — R0789 Other chest pain: Secondary | ICD-10-CM

## 2024-04-08 DIAGNOSIS — I340 Nonrheumatic mitral (valve) insufficiency: Secondary | ICD-10-CM

## 2024-04-08 DIAGNOSIS — I10 Essential (primary) hypertension: Secondary | ICD-10-CM

## 2024-04-11 ENCOUNTER — Ambulatory Visit (INDEPENDENT_AMBULATORY_CARE_PROVIDER_SITE_OTHER): Admitting: Cardiovascular Disease

## 2024-04-11 ENCOUNTER — Ambulatory Visit: Payer: Self-pay | Admitting: Cardiovascular Disease

## 2024-04-11 ENCOUNTER — Ambulatory Visit
Admission: RE | Admit: 2024-04-11 | Discharge: 2024-04-11 | Disposition: A | Discharge status: Home / Self Care | Source: Ambulatory Visit | Attending: Cardiovascular Disease | Admitting: Cardiovascular Disease

## 2024-04-11 ENCOUNTER — Encounter
Admission: RE | Disposition: A | Discharge status: Home / Self Care | Payer: Self-pay | Source: Ambulatory Visit | Attending: Cardiovascular Disease

## 2024-04-11 ENCOUNTER — Encounter: Payer: Self-pay | Admitting: Cardiovascular Disease

## 2024-04-11 VITALS — BP 138/79 | HR 73 | Ht 74.0 in | Wt 227.2 lb

## 2024-04-11 DIAGNOSIS — I10 Essential (primary) hypertension: Secondary | ICD-10-CM

## 2024-04-11 DIAGNOSIS — R0789 Other chest pain: Secondary | ICD-10-CM | POA: Insufficient documentation

## 2024-04-11 DIAGNOSIS — I200 Unstable angina: Secondary | ICD-10-CM

## 2024-04-11 DIAGNOSIS — I25118 Atherosclerotic heart disease of native coronary artery with other forms of angina pectoris: Secondary | ICD-10-CM | POA: Diagnosis not present

## 2024-04-11 DIAGNOSIS — R0602 Shortness of breath: Secondary | ICD-10-CM

## 2024-04-11 DIAGNOSIS — I2510 Atherosclerotic heart disease of native coronary artery without angina pectoris: Secondary | ICD-10-CM | POA: Insufficient documentation

## 2024-04-11 DIAGNOSIS — Z9861 Coronary angioplasty status: Secondary | ICD-10-CM

## 2024-04-11 DIAGNOSIS — Z955 Presence of coronary angioplasty implant and graft: Secondary | ICD-10-CM

## 2024-04-11 DIAGNOSIS — I209 Angina pectoris, unspecified: Secondary | ICD-10-CM

## 2024-04-11 DIAGNOSIS — E782 Mixed hyperlipidemia: Secondary | ICD-10-CM

## 2024-04-11 SURGERY — LEFT HEART CATH AND CORONARY ANGIOGRAPHY
Anesthesia: Moderate Sedation | Laterality: Left

## 2024-04-11 MED ORDER — SODIUM CHLORIDE 0.9% FLUSH: 3.0000 mL | INTRAVENOUS | Status: DC | PRN

## 2024-04-11 MED ORDER — RANOLAZINE ER 500 MG PO TB12: 500.0000 mg | ORAL_TABLET | Freq: Two times a day (BID) | ORAL | 2 refills | Status: DC

## 2024-04-11 MED ORDER — NITROGLYCERIN 0.4 MG SL SUBL
0.4000 mg | SUBLINGUAL_TABLET | Freq: Once | SUBLINGUAL | Status: AC
  Administered 2024-04-11: 0.4 mg via SUBLINGUAL

## 2024-04-11 MED ORDER — SODIUM CHLORIDE 0.9% FLUSH: 3.0000 mL | Freq: Two times a day (BID) | INTRAVENOUS | Status: DC

## 2024-04-11 MED ORDER — ASPIRIN 81 MG PO CHEW: 81.0000 mg | CHEWABLE_TABLET | ORAL | Status: DC

## 2024-04-11 MED ORDER — SODIUM CHLORIDE 0.9 % WEIGHT BASED INFUSION: 1.0000 mL/kg/h | INTRAVENOUS | Status: DC

## 2024-04-11 MED ORDER — SODIUM CHLORIDE 0.9 % WEIGHT BASED INFUSION: 3.0000 mL/kg/h | INTRAVENOUS | Status: DC

## 2024-04-11 MED ORDER — ISOSORBIDE MONONITRATE ER 30 MG PO TB24: 30.0000 mg | ORAL_TABLET | Freq: Two times a day (BID) | ORAL | 1 refills | Status: AC

## 2024-04-11 MED ORDER — SODIUM CHLORIDE 0.9 % IV SOLN: 250.0000 mL | INTRAVENOUS | Status: DC | PRN

## 2024-04-11 NOTE — Progress Notes (Signed)
 Cardiology Office Note   Date:  04/11/2024   ID:  Jesse May, DOB [birth year], MRN [id number]  PCP:  Carin Gauze, NP  Cardiologist:  Denyse Bathe, MD      History of Present Illness: Jesse May is a 68 y.o. male who presents for  Chief Complaint  Patient presents with   Acute Visit    Chest pain    This is a 68 year old white male with onset of chest pain since yesterday and been having intermittently all night and still having it.  It is described as pressure type associated with shortness of breath and diaphoresis.  He actually came for follow-up of echocardiogram which showed EF of 50% mild mitral vegetation and mild tricuspid regurgitation.  Chest Pain  This is a new problem. The current episode started today. The onset quality is sudden. The problem occurs 2 to 4 times per day. The problem has been unchanged. The pain is present in the substernal region. The pain is at a severity of 7/10. The quality of the pain is described as tightness.      Past Medical History:  Diagnosis Date   MI (myocardial infarction) [facility])      Past Surgical History:  Procedure Laterality Date   APPENDECTOMY     CARDIAC CATHETERIZATION  2015   stent left circumflex-per Dr. KATHEE. [facility] note Care Everywhere   CHOLECYSTECTOMY     CORONARY BALLOON ANGIOPLASTY N/A 03/26/2023   Procedure: CORONARY BALLOON ANGIOPLASTY;  Surgeon: Anner Alm ORN, MD;  Location: [facility] INVASIVE CV LAB;  Service: Cardiovascular;  Laterality: N/A;   LEFT HEART CATH AND CORONARY ANGIOGRAPHY Left 03/26/2023   Procedure: LEFT HEART CATH AND CORONARY ANGIOGRAPHY;  Surgeon: Bathe Denyse DELENA, MD;  Location: [facility] INVASIVE CV LAB;  Service: Cardiovascular;  Laterality: Left;   TONSILLECTOMY       Current Outpatient Medications  Medication Sig Dispense Refill   amLODipine  (NORVASC ) 10 MG tablet Take 1 tablet (10 mg total) by mouth daily. 30 tablet 11   Aspirin  81 MG CAPS Aspirin  81 MG Oral Capsule QTY: 0 capsule Days:  0 Refills: 0  Written: 02/07/21 Patient Instructions:     chlorthalidone  (HYGROTON ) 25 MG tablet Take 1 tablet (25 mg total) by mouth daily. 30 tablet 1   esomeprazole  (NEXIUM ) 40 MG capsule Take 1 capsule by mouth once daily 60 capsule 0   fluticasone  (FLONASE ) 50 MCG/ACT nasal spray Place 1 spray into both nostrils daily. 15.8 mL 2   furosemide  (LASIX ) 20 MG tablet Take 1 tablet (20 mg total) by mouth daily. 30 tablet 11   isosorbide  mononitrate (IMDUR ) 30 MG 24 hr tablet Take 1 tablet by mouth once daily 60 tablet 0   isosorbide  mononitrate (IMDUR ) 30 MG 24 hr tablet Take 1 tablet (30 mg total) by mouth 2 (two) times daily. 60 tablet 1   isosorbide  mononitrate (IMDUR ) 30 MG 24 hr tablet Take 1 tablet (30 mg total) by mouth 2 (two) times daily. 30 tablet 1   ketorolac  (TORADOL ) 10 MG tablet Take 1 tablet (10 mg total) by mouth every 6 (six) hours as needed. 20 tablet 0   levothyroxine  (SYNTHROID ) 137 MCG tablet Take 1 tablet (137 mcg total) by mouth daily before breakfast. 90 tablet 2   losartan  (COZAAR ) 100 MG tablet Take 1 tablet (100 mg total) by mouth daily. 30 tablet 11   methylPREDNISolone  (MEDROL  DOSEPAK) 4 MG TBPK tablet As directed 1 each 0   metoprolol  succinate (TOPROL  XL)  50 MG 24 hr tablet Take 1 tablet (50 mg total) by mouth daily. Take with or immediately following a meal. 30 tablet 11   nitroGLYCERIN  (NITROSTAT ) 0.4 MG SL tablet Place 1 tablet (0.4 mg total) under the tongue every 5 (five) minutes as needed for chest pain. 100 tablet 3   potassium chloride  (KLOR-CON ) 10 MEQ tablet Take 1 tablet by mouth twice daily 30 tablet 0   ranolazine (RANEXA) 500 MG 12 hr tablet Take 1 tablet (500 mg total) by mouth 2 (two) times daily. 60 tablet 2   rosuvastatin  (CRESTOR ) 40 MG tablet Take 40 mg by mouth daily.     traZODone (DESYREL) 100 MG tablet TAKE 1 TABLET BY MOUTH NIGHTLY AT BEDTIME AS NEEDED FOR SLEEP (NOTE  INCREASED  DOSAGE) 90 tablet 0   No current facility-administered  medications for this visit.    Allergies:   Patient has no known allergies.    Social History:   reports that he has never smoked. He has never used smokeless tobacco. He reports that he does not currently use alcohol. He reports that he does not currently use drugs.   Family History:  family history is not on file.    ROS:     Review of Systems  Constitutional: Negative.   HENT: Negative.    Eyes: Negative.   Respiratory: Negative.    Cardiovascular:  Positive for chest pain.  Gastrointestinal: Negative.   Genitourinary: Negative.   Musculoskeletal: Negative.   Skin: Negative.   Neurological: Negative.   Endo/Heme/Allergies: Negative.   Psychiatric/Behavioral: Negative.    All other systems reviewed and are negative.     All other systems are reviewed and negative.    PHYSICAL EXAM: VS:  BP 138/79   Pulse 73   Ht 6' 2 (1.88 m)   Wt 227 lb 3.2 oz (103.1 kg)   SpO2 95%   BMI 29.17 kg/m  , BMI Body mass index is 29.17 kg/m. Last weight:  Wt Readings from Last 3 Encounters:  04/11/24 227 lb 3.2 oz (103.1 kg)  02/22/24 224 lb (101.6 kg)  02/16/24 223 lb (101.2 kg)     Physical Exam Vitals reviewed.  Constitutional:      Appearance: Normal appearance. He is normal weight.  HENT:     Head: Normocephalic.     Nose: Nose normal.     Mouth/Throat:     Mouth: Mucous membranes are moist.   Eyes:     Pupils: Pupils are equal, round, and reactive to light.    Cardiovascular:     Rate and Rhythm: Normal rate and regular rhythm.     Pulses: Normal pulses.     Heart sounds: Normal heart sounds.  Pulmonary:     Effort: Pulmonary effort is normal.  Abdominal:     General: Abdomen is flat. Bowel sounds are normal.   Musculoskeletal:        General: Normal range of motion.     Cervical back: Normal range of motion.   Skin:    General: Skin is warm.   Neurological:     General: No focal deficit present.     Mental Status: He is alert.   Psychiatric:         Mood and Affect: Mood normal.       EKG: Normal sinus rhythm 60 bpm with incomplete right bundle branch block and left anterior fascicular block no acute ST elevation or ST depression.  Recent Labs: 02/09/2024: ALT 20; BUN 16;  Creatinine, Ser 1.21; Potassium 4.0; Sodium 141; TSH 5.840    Lipid Panel    Component Value Date/Time   CHOL 118 02/09/2024 1217   TRIG 58 02/09/2024 1217   HDL 36 (L) 02/09/2024 1217   CHOLHDL 3.3 02/09/2024 1217   LDLCALC 69 02/09/2024 1217      Other studies Reviewed: Additional studies/ records that were reviewed today include:  Review of the above records demonstrates:       No data to display            ASSESSMENT AND PLAN:    ICD-10-CM   1. Other chest pain  R07.89 ranolazine (RANEXA) 500 MG 12 hr tablet    isosorbide  mononitrate (IMDUR ) 30 MG 24 hr tablet    Comprehensive metabolic panel    CBC with Differential/Platelet   Having chest pain at rest we will do EKG and give nitroglycerin .  EKG shows 60/min ICRBB, LAHB. Advise cath as soon as possible.    2. Coronary artery disease involving native coronary artery of native heart with other form of angina pectoris (HCC)  I25.118 ranolazine (RANEXA) 500 MG 12 hr tablet    isosorbide  mononitrate (IMDUR ) 30 MG 24 hr tablet    Comprehensive metabolic panel    CBC with Differential/Platelet    3. Progressive angina (HCC)  I20.0 ranolazine (RANEXA) 500 MG 12 hr tablet    isosorbide  mononitrate (IMDUR ) 30 MG 24 hr tablet    Comprehensive metabolic panel    CBC with Differential/Platelet   If chest pain does not resolve go to the emergency room otherwise we will set up cardiac catheterization tomorrow.    4. Essential hypertension  I10 ranolazine (RANEXA) 500 MG 12 hr tablet    isosorbide  mononitrate (IMDUR ) 30 MG 24 hr tablet    Comprehensive metabolic panel    CBC with Differential/Platelet    5. Angina, class III (HCC)  I20.9 nitroGLYCERIN  (NITROSTAT ) SL tablet 0.4 mg     ranolazine (RANEXA) 500 MG 12 hr tablet    isosorbide  mononitrate (IMDUR ) 30 MG 24 hr tablet    Comprehensive metabolic panel    CBC with Differential/Platelet   Add Ranexa 500 p.o. twice daily and change isosorbide  to 30 twice daily.  And set up cardiac catheterization.  Cardiac catheterization either today or tomorrow    6. SOB (shortness of breath)  R06.02 ranolazine (RANEXA) 500 MG 12 hr tablet    isosorbide  mononitrate (IMDUR ) 30 MG 24 hr tablet    Comprehensive metabolic panel    CBC with Differential/Platelet    7. Hyperlipidemia, mixed  E78.2 ranolazine (RANEXA) 500 MG 12 hr tablet    isosorbide  mononitrate (IMDUR ) 30 MG 24 hr tablet    Comprehensive metabolic panel    CBC with Differential/Platelet    8. History of PTCA  Z98.61 ranolazine (RANEXA) 500 MG 12 hr tablet    isosorbide  mononitrate (IMDUR ) 30 MG 24 hr tablet    Comprehensive metabolic panel    CBC with Differential/Platelet   Status post PTCA of left circumflex in June from 90% to 0%.  Patient still had 55% mid LAD lesion.       Problem List Items Addressed This Visit       Cardiovascular and Mediastinum   Essential hypertension (Chronic)   Relevant Medications   ranolazine (RANEXA) 500 MG 12 hr tablet   isosorbide  mononitrate (IMDUR ) 30 MG 24 hr tablet   Other Relevant Orders   Comprehensive metabolic panel   CBC with Differential/Platelet  Angina, class III (HCC)   Relevant Medications   ranolazine (RANEXA) 500 MG 12 hr tablet   isosorbide  mononitrate (IMDUR ) 30 MG 24 hr tablet   Other Relevant Orders   Comprehensive metabolic panel   CBC with Differential/Platelet   Coronary artery disease   Relevant Medications   ranolazine (RANEXA) 500 MG 12 hr tablet   isosorbide  mononitrate (IMDUR ) 30 MG 24 hr tablet   Other Relevant Orders   Comprehensive metabolic panel   CBC with Differential/Platelet   Progressive angina (HCC)   Relevant Medications   ranolazine (RANEXA) 500 MG 12 hr tablet    isosorbide  mononitrate (IMDUR ) 30 MG 24 hr tablet   Other Relevant Orders   Comprehensive metabolic panel   CBC with Differential/Platelet     Other   Hyperlipidemia, mixed (Chronic)   Relevant Medications   ranolazine (RANEXA) 500 MG 12 hr tablet   isosorbide  mononitrate (IMDUR ) 30 MG 24 hr tablet   Other Relevant Orders   Comprehensive metabolic panel   CBC with Differential/Platelet   SOB (shortness of breath)   Relevant Medications   ranolazine (RANEXA) 500 MG 12 hr tablet   isosorbide  mononitrate (IMDUR ) 30 MG 24 hr tablet   Other Relevant Orders   Comprehensive metabolic panel   CBC with Differential/Platelet   Other chest pain - Primary   Relevant Medications   ranolazine (RANEXA) 500 MG 12 hr tablet   isosorbide  mononitrate (IMDUR ) 30 MG 24 hr tablet   Other Relevant Orders   Comprehensive metabolic panel   CBC with Differential/Platelet   Other Visit Diagnoses       History of PTCA       Status post PTCA of left circumflex in June from 90% to 0%.  Patient still had 55% mid LAD lesion.   Relevant Medications   ranolazine (RANEXA) 500 MG 12 hr tablet   isosorbide  mononitrate (IMDUR ) 30 MG 24 hr tablet   Other Relevant Orders   Comprehensive metabolic panel   CBC with Differential/Platelet          Disposition:   Return in about 1 week (around 04/18/2024) for Set up cardiac catheterization either today or tomorrow and then follow-up.    Total time spent: 50 minutes  Signed,  Denyse Bathe, MD  04/11/2024 11:37 AM    [facility]

## 2024-04-11 NOTE — OR Nursing (Signed)
 When bringing patient into pre procedure department pt reported he did not have a ride home. Informed him that he couldn't drive home if procedure only diagnostic.Dr Fernand notified via messenger and phone by myself and Donny payne charge nurse. Pt decided after delay to go home and come reschedule with Dr Fernand.Nursing staff stressed that if he had another episode of chest pain with NTG to call [phone]

## 2024-04-12 ENCOUNTER — Ambulatory Visit (INDEPENDENT_AMBULATORY_CARE_PROVIDER_SITE_OTHER): Admitting: Cardiovascular Disease

## 2024-04-12 ENCOUNTER — Encounter: Payer: Self-pay | Admitting: Cardiovascular Disease

## 2024-04-12 VITALS — BP 150/70 | HR 58 | Ht 74.0 in | Wt 227.4 lb

## 2024-04-12 DIAGNOSIS — I10 Essential (primary) hypertension: Secondary | ICD-10-CM | POA: Diagnosis not present

## 2024-04-12 DIAGNOSIS — R0602 Shortness of breath: Secondary | ICD-10-CM

## 2024-04-12 DIAGNOSIS — Z9861 Coronary angioplasty status: Secondary | ICD-10-CM

## 2024-04-12 DIAGNOSIS — R0789 Other chest pain: Secondary | ICD-10-CM

## 2024-04-12 DIAGNOSIS — E782 Mixed hyperlipidemia: Secondary | ICD-10-CM

## 2024-04-12 DIAGNOSIS — I200 Unstable angina: Secondary | ICD-10-CM

## 2024-04-12 LAB — CBC WITH DIFFERENTIAL/PLATELET
Basophils Absolute: 0 10*3/uL (ref 0.0–0.2)
Basos: 1 %
EOS (ABSOLUTE): 0.2 10*3/uL (ref 0.0–0.4)
Eos: 3 %
Hematocrit: 48.6 % (ref 37.5–51.0)
Hemoglobin: 15.4 g/dL (ref 13.0–17.7)
Immature Grans (Abs): 0 10*3/uL (ref 0.0–0.1)
Immature Granulocytes: 0 %
Lymphocytes Absolute: 0.8 10*3/uL (ref 0.7–3.1)
Lymphs: 17 %
MCH: 28.5 pg (ref 26.6–33.0)
MCHC: 31.7 g/dL (ref 31.5–35.7)
MCV: 90 fL (ref 79–97)
Monocytes Absolute: 0.4 10*3/uL (ref 0.1–0.9)
Monocytes: 9 %
Neutrophils Absolute: 3.5 10*3/uL (ref 1.4–7.0)
Neutrophils: 70 %
Platelets: 193 10*3/uL (ref 150–450)
RBC: 5.41 x10E6/uL (ref 4.14–5.80)
RDW: 14.4 % (ref 11.6–15.4)
WBC: 5 10*3/uL (ref 3.4–10.8)

## 2024-04-12 LAB — COMPREHENSIVE METABOLIC PANEL WITH GFR
ALT: 19 IU/L (ref 0–44)
AST: 20 IU/L (ref 0–40)
Albumin: 4.3 g/dL (ref 3.9–4.9)
Alkaline Phosphatase: 70 IU/L (ref 44–121)
BUN/Creatinine Ratio: 12 (ref 10–24)
BUN: 13 mg/dL (ref 8–27)
Bilirubin Total: 0.7 mg/dL (ref 0.0–1.2)
CO2: 20 mmol/L (ref 20–29)
Calcium: 9.1 mg/dL (ref 8.6–10.2)
Chloride: 102 mmol/L (ref 96–106)
Creatinine, Ser: 1.11 mg/dL (ref 0.76–1.27)
Globulin, Total: 1.5 g/dL (ref 1.5–4.5)
Glucose: 87 mg/dL (ref 70–99)
Potassium: 4 mmol/L (ref 3.5–5.2)
Sodium: 141 mmol/L (ref 134–144)
Total Protein: 5.8 g/dL — ABNORMAL LOW (ref 6.0–8.5)
eGFR: 72 mL/min/{1.73_m2} (ref >59)

## 2024-04-12 NOTE — Progress Notes (Signed)
 Cardiology Office Note   Date:  04/12/2024   ID:  EMMANUEL GRUENHAGEN, DOB 1956/06/18, MRN [id number]  PCP:  Carin Gauze, NP  Cardiologist:  Denyse Bathe, MD      History of Present Illness: Jesse May is a 68 y.o. male who presents for  Chief Complaint  Patient presents with   Follow-up    Follow up    Still has chest pain.      Past Medical History:  Diagnosis Date   MI (myocardial infarction) [facility])      Past Surgical History:  Procedure Laterality Date   APPENDECTOMY     CARDIAC CATHETERIZATION  2015   stent left circumflex-per Dr. KATHEE. [facility] note Care Everywhere   CHOLECYSTECTOMY     CORONARY BALLOON ANGIOPLASTY N/A 03/26/2023   Procedure: CORONARY BALLOON ANGIOPLASTY;  Surgeon: Anner Alm ORN, MD;  Location: [facility] INVASIVE CV LAB;  Service: Cardiovascular;  Laterality: N/A;   LEFT HEART CATH AND CORONARY ANGIOGRAPHY Left 03/26/2023   Procedure: LEFT HEART CATH AND CORONARY ANGIOGRAPHY;  Surgeon: Bathe Denyse DELENA, MD;  Location: [facility] INVASIVE CV LAB;  Service: Cardiovascular;  Laterality: Left;   TONSILLECTOMY       Current Outpatient Medications  Medication Sig Dispense Refill   amLODipine  (NORVASC ) 10 MG tablet Take 1 tablet (10 mg total) by mouth daily. 30 tablet 11   Aspirin  81 MG CAPS Aspirin  81 MG Oral Capsule QTY: 0 capsule Days: 0 Refills: 0  Written: 02/07/21 Patient Instructions:     chlorthalidone  (HYGROTON ) 25 MG tablet Take 1 tablet (25 mg total) by mouth daily. 30 tablet 1   esomeprazole  (NEXIUM ) 40 MG capsule Take 1 capsule by mouth once daily 60 capsule 0   fluticasone  (FLONASE ) 50 MCG/ACT nasal spray Place 1 spray into both nostrils daily. 15.8 mL 2   furosemide  (LASIX ) 20 MG tablet Take 1 tablet (20 mg total) by mouth daily. 30 tablet 11   isosorbide  mononitrate (IMDUR ) 30 MG 24 hr tablet Take 1 tablet (30 mg total) by mouth 2 (two) times daily. 30 tablet 1   ketorolac  (TORADOL ) 10 MG tablet Take 1 tablet (10 mg total) by mouth every 6 (six)  hours as needed. 20 tablet 0   levothyroxine  (SYNTHROID ) 137 MCG tablet Take 1 tablet (137 mcg total) by mouth daily before breakfast. 90 tablet 2   losartan  (COZAAR ) 100 MG tablet Take 1 tablet (100 mg total) by mouth daily. 30 tablet 11   metoprolol  succinate (TOPROL  XL) 50 MG 24 hr tablet Take 1 tablet (50 mg total) by mouth daily. Take with or immediately following a meal. 30 tablet 11   nitroGLYCERIN  (NITROSTAT ) 0.4 MG SL tablet Place 1 tablet (0.4 mg total) under the tongue every 5 (five) minutes as needed for chest pain. 100 tablet 3   potassium chloride  (KLOR-CON ) 10 MEQ tablet Take 1 tablet by mouth twice daily 30 tablet 0   ranolazine (RANEXA) 500 MG 12 hr tablet Take 1 tablet (500 mg total) by mouth 2 (two) times daily. 60 tablet 2   rosuvastatin  (CRESTOR ) 40 MG tablet Take 40 mg by mouth daily.     traZODone (DESYREL) 100 MG tablet TAKE 1 TABLET BY MOUTH NIGHTLY AT BEDTIME AS NEEDED FOR SLEEP (NOTE  INCREASED  DOSAGE) 90 tablet 0   methylPREDNISolone  (MEDROL  DOSEPAK) 4 MG TBPK tablet As directed (Patient not taking: Reported on 04/12/2024) 1 each 0   No current facility-administered medications for this visit.    Allergies:  Patient has no known allergies.    Social History:   reports that he has never smoked. He has never used smokeless tobacco. He reports that he does not currently use alcohol. He reports that he does not currently use drugs.   Family History:  family history is not on file.    ROS:     Review of Systems  Constitutional: Negative.   HENT: Negative.    Eyes: Negative.   Respiratory: Negative.    Gastrointestinal: Negative.   Genitourinary: Negative.   Musculoskeletal: Negative.   Skin: Negative.   Neurological: Negative.   Endo/Heme/Allergies: Negative.   Psychiatric/Behavioral: Negative.    All other systems reviewed and are negative.     All other systems are reviewed and negative.    PHYSICAL EXAM: VS:  BP (!) 150/70   Pulse (!) 58   Ht 6'  2 (1.88 m)   Wt 227 lb 6.4 oz (103.1 kg)   SpO2 96%   BMI 29.20 kg/m  , BMI Body mass index is 29.2 kg/m. Last weight:  Wt Readings from Last 3 Encounters:  04/12/24 227 lb 6.4 oz (103.1 kg)  04/11/24 227 lb 3.2 oz (103.1 kg)  02/22/24 224 lb (101.6 kg)     Physical Exam Vitals reviewed.  Constitutional:      Appearance: Normal appearance. He is normal weight.  HENT:     Head: Normocephalic.     Nose: Nose normal.     Mouth/Throat:     Mouth: Mucous membranes are moist.   Eyes:     Pupils: Pupils are equal, round, and reactive to light.    Cardiovascular:     Rate and Rhythm: Normal rate and regular rhythm.     Pulses: Normal pulses.     Heart sounds: Normal heart sounds.  Pulmonary:     Effort: Pulmonary effort is normal.  Abdominal:     General: Abdomen is flat. Bowel sounds are normal.   Musculoskeletal:        General: Normal range of motion.     Cervical back: Normal range of motion.   Skin:    General: Skin is warm.   Neurological:     General: No focal deficit present.     Mental Status: He is alert.   Psychiatric:        Mood and Affect: Mood normal.       EKG:   Recent Labs: 02/09/2024: TSH 5.840 04/11/2024: ALT 19; BUN 13; Creatinine, Ser 1.11; Hemoglobin 15.4; Platelets 193; Potassium 4.0; Sodium 141    Lipid Panel    Component Value Date/Time   CHOL 118 02/09/2024 1217   TRIG 58 02/09/2024 1217   HDL 36 (L) 02/09/2024 1217   CHOLHDL 3.3 02/09/2024 1217   LDLCALC 69 02/09/2024 1217      Other studies Reviewed: Additional studies/ records that were reviewed today include:  Review of the above records demonstrates:       No data to display            ASSESSMENT AND PLAN:    ICD-10-CM   1. History of PTCA  Z98.61     2. Hyperlipidemia, mixed  E78.2     3. Essential hypertension  I10     4. Other chest pain  R07.89     5. SOB (shortness of breath)  R06.02     6. Unstable angina pectoris (HCC)  I20.0    increase  isosrbide 30 bid, add ranexa 500 bid. Reschedual  cath again as had no ride to take home and got cancelled yesterday.       Problem List Items Addressed This Visit       Cardiovascular and Mediastinum   Essential hypertension (Chronic)     Other   Hyperlipidemia, mixed (Chronic)   SOB (shortness of breath)   Other chest pain   Other Visit Diagnoses       History of PTCA    -  Primary     Unstable angina pectoris (HCC)       increase isosrbide 30 bid, add ranexa 500 bid. Reschedual cath again as had no ride to take home and got cancelled yesterday.          Disposition:   Return in about 1 week (around 04/19/2024) for set up cath again, Do I need new orders again.    Total time spent: 30 minutes  Signed,  Denyse Bathe, MD  04/12/2024 9:56 AM    [facility]

## 2024-04-14 ENCOUNTER — Ambulatory Visit: Admitting: Cardiovascular Disease

## 2024-04-19 ENCOUNTER — Encounter
Admission: RE | Disposition: A | Discharge status: Home / Self Care | Payer: Self-pay | Source: Home / Self Care | Attending: Internal Medicine

## 2024-04-19 ENCOUNTER — Observation Stay
Admission: RE | Admit: 2024-04-19 | Discharge: 2024-04-20 | Disposition: A | Discharge status: Home / Self Care | Attending: Internal Medicine | Admitting: Internal Medicine

## 2024-04-19 ENCOUNTER — Other Ambulatory Visit: Payer: Self-pay

## 2024-04-19 ENCOUNTER — Encounter: Payer: Self-pay | Admitting: Cardiovascular Disease

## 2024-04-19 DIAGNOSIS — Z79899 Other long term (current) drug therapy: Secondary | ICD-10-CM | POA: Diagnosis not present

## 2024-04-19 DIAGNOSIS — I10 Essential (primary) hypertension: Secondary | ICD-10-CM | POA: Insufficient documentation

## 2024-04-19 DIAGNOSIS — I25110 Atherosclerotic heart disease of native coronary artery with unstable angina pectoris: Secondary | ICD-10-CM | POA: Diagnosis present

## 2024-04-19 DIAGNOSIS — R079 Chest pain, unspecified: Secondary | ICD-10-CM

## 2024-04-19 DIAGNOSIS — Z7982 Long term (current) use of aspirin: Secondary | ICD-10-CM | POA: Diagnosis not present

## 2024-04-19 DIAGNOSIS — E782 Mixed hyperlipidemia: Secondary | ICD-10-CM | POA: Diagnosis not present

## 2024-04-19 DIAGNOSIS — I200 Unstable angina: Secondary | ICD-10-CM | POA: Diagnosis not present

## 2024-04-19 HISTORY — PX: CORONARY IMAGING/OCT: CATH118326

## 2024-04-19 HISTORY — PX: LEFT HEART CATH AND CORONARY ANGIOGRAPHY: CATH118249

## 2024-04-19 HISTORY — PX: CORONARY STENT INTERVENTION: CATH118234

## 2024-04-19 LAB — POCT ACTIVATED CLOTTING TIME
Activated Clotting Time: 239 s
Activated Clotting Time: 239 s
Activated Clotting Time: 274 s
Activated Clotting Time: 279 s
Activated Clotting Time: 187 s
Activated Clotting Time: 199 s
Activated Clotting Time: 153 s

## 2024-04-19 SURGERY — LEFT HEART CATH AND CORONARY ANGIOGRAPHY
Anesthesia: Moderate Sedation

## 2024-04-19 SURGERY — LEFT HEART CATH AND CORONARY ANGIOGRAPHY
Anesthesia: Moderate Sedation | Laterality: Right

## 2024-04-19 MED ORDER — NITROGLYCERIN 1 MG/10 ML FOR IR/CATH LAB
INTRA_ARTERIAL | Status: DC | PRN
  Administered 2024-04-19: 200 ug via INTRA_ARTERIAL

## 2024-04-19 MED ORDER — HEPARIN SODIUM (PORCINE) 1000 UNIT/ML IJ SOLN
INTRAMUSCULAR | Status: DC | PRN
  Administered 2024-04-19: 9000 [IU] via INTRAVENOUS

## 2024-04-19 MED ORDER — SODIUM CHLORIDE 0.9 % WEIGHT BASED INFUSION: 1.0000 mL/kg/h | INTRAVENOUS | Status: DC

## 2024-04-19 MED ORDER — IOHEXOL 300 MG/ML  SOLN
INTRAMUSCULAR | Status: DC | PRN
  Administered 2024-04-19: 62 mL

## 2024-04-19 MED ORDER — METOPROLOL SUCCINATE ER 50 MG PO TB24
50.0000 mg | ORAL_TABLET | Freq: Two times a day (BID) | ORAL | Status: DC
  Administered 2024-04-19 – 2024-04-20 (×2): 50 mg via ORAL
  Filled 2024-04-19 (×2): qty 1

## 2024-04-19 MED ORDER — SODIUM CHLORIDE 0.9% FLUSH
3.0000 mL | Freq: Two times a day (BID) | INTRAVENOUS | Status: DC
  Administered 2024-04-19 – 2024-04-20 (×2): 3 mL via INTRAVENOUS

## 2024-04-19 MED ORDER — CLOPIDOGREL BISULFATE 75 MG PO TABS: ORAL_TABLET | ORAL | Status: DC | PRN

## 2024-04-19 MED ORDER — MIDAZOLAM HCL 2 MG/2ML IJ SOLN
INTRAMUSCULAR | Status: AC
  Filled 2024-04-19: qty 2

## 2024-04-19 MED ORDER — MIDAZOLAM HCL 2 MG/2ML IJ SOLN
INTRAMUSCULAR | Status: DC | PRN
  Administered 2024-04-19: 1 mg via INTRAVENOUS

## 2024-04-19 MED ORDER — LIDOCAINE HCL (PF) 1 % IJ SOLN
INTRAMUSCULAR | Status: DC | PRN
  Administered 2024-04-19: 10 mL

## 2024-04-19 MED ORDER — IOHEXOL 300 MG/ML  SOLN
INTRAMUSCULAR | Status: DC | PRN
  Administered 2024-04-19: 108 mL

## 2024-04-19 MED ORDER — ROSUVASTATIN CALCIUM 20 MG PO TABS
40.0000 mg | ORAL_TABLET | Freq: Every day | ORAL | Status: DC
  Administered 2024-04-19 – 2024-04-20 (×2): 40 mg via ORAL
  Filled 2024-04-19: qty 2
  Filled 2024-04-19 (×2): qty 4
  Filled 2024-04-19: qty 2

## 2024-04-19 MED ORDER — SODIUM CHLORIDE 0.9 % WEIGHT BASED INFUSION
3.0000 mL/kg/h | INTRAVENOUS | Status: DC
  Administered 2024-04-19: 3 mL/kg/h via INTRAVENOUS

## 2024-04-19 MED ORDER — FENTANYL CITRATE (PF) 100 MCG/2ML IJ SOLN: 25.0000 ug | Freq: Once | INTRAMUSCULAR | Status: AC

## 2024-04-19 MED ORDER — SODIUM CHLORIDE 0.9 % IV SOLN: INTRAVENOUS | Status: AC

## 2024-04-19 MED ORDER — FENTANYL CITRATE (PF) 100 MCG/2ML IJ SOLN
INTRAMUSCULAR | Status: DC | PRN
  Administered 2024-04-19: 50 ug via INTRAVENOUS

## 2024-04-19 MED ORDER — FENTANYL CITRATE (PF) 100 MCG/2ML IJ SOLN
INTRAMUSCULAR | Status: AC
  Administered 2024-04-19: 25 ug via INTRAVENOUS
  Filled 2024-04-19: qty 2

## 2024-04-19 MED ORDER — ENOXAPARIN SODIUM 40 MG/0.4ML IJ SOSY
40.0000 mg | PREFILLED_SYRINGE | INTRAMUSCULAR | Status: DC
  Administered 2024-04-20: 40 mg via SUBCUTANEOUS
  Filled 2024-04-19: qty 0.4

## 2024-04-19 MED ORDER — ASPIRIN 81 MG PO CHEW
CHEWABLE_TABLET | ORAL | Status: AC
  Administered 2024-04-19: 81 mg via ORAL
  Filled 2024-04-19: qty 1

## 2024-04-19 MED ORDER — NITROGLYCERIN 1 MG/10 ML FOR IR/CATH LAB
INTRA_ARTERIAL | Status: AC
Start: 2024-04-19 — End: 2024-04-19
  Filled 2024-04-19: qty 10

## 2024-04-19 MED ORDER — CLOPIDOGREL BISULFATE 75 MG PO TABS
ORAL_TABLET | ORAL | Status: AC
Start: 2024-04-19 — End: 2024-04-19
  Filled 2024-04-19: qty 8

## 2024-04-19 MED ORDER — AMLODIPINE BESYLATE 5 MG PO TABS
10.0000 mg | ORAL_TABLET | Freq: Every day | ORAL | Status: DC
  Administered 2024-04-19 – 2024-04-20 (×2): 10 mg via ORAL
  Filled 2024-04-19 (×2): qty 2

## 2024-04-19 MED ORDER — ASPIRIN 81 MG PO CHEW: 81.0000 mg | CHEWABLE_TABLET | ORAL | Status: AC

## 2024-04-19 MED ORDER — ACETAMINOPHEN 325 MG PO TABS: 650.0000 mg | ORAL_TABLET | ORAL | Status: DC | PRN

## 2024-04-19 MED ORDER — HEPARIN SODIUM (PORCINE) 1000 UNIT/ML IJ SOLN
INTRAMUSCULAR | Status: AC
  Filled 2024-04-19: qty 10

## 2024-04-19 MED ORDER — PANTOPRAZOLE SODIUM 40 MG PO TBEC
40.0000 mg | DELAYED_RELEASE_TABLET | Freq: Every day | ORAL | Status: DC
  Administered 2024-04-20: 40 mg via ORAL
  Filled 2024-04-19: qty 1

## 2024-04-19 MED ORDER — HEPARIN SODIUM (PORCINE) 1000 UNIT/ML IJ SOLN: INTRAMUSCULAR | Status: DC | PRN

## 2024-04-19 MED ORDER — CLOPIDOGREL BISULFATE 75 MG PO TABS
ORAL_TABLET | ORAL | Status: DC | PRN
  Administered 2024-04-19: 600 mg via ORAL

## 2024-04-19 MED ORDER — LOSARTAN POTASSIUM 50 MG PO TABS
100.0000 mg | ORAL_TABLET | Freq: Every day | ORAL | Status: DC
  Administered 2024-04-19 – 2024-04-20 (×2): 100 mg via ORAL
  Filled 2024-04-19 (×2): qty 2

## 2024-04-19 MED ORDER — SODIUM CHLORIDE 0.9 % IV SOLN: 250.0000 mL | INTRAVENOUS | Status: DC | PRN

## 2024-04-19 MED ORDER — HEPARIN (PORCINE) IN NACL 1000-0.9 UT/500ML-% IV SOLN
INTRAVENOUS | Status: DC | PRN
  Administered 2024-04-19: 1000 mL

## 2024-04-19 MED ORDER — LEVOTHYROXINE SODIUM 137 MCG PO TABS
137.0000 ug | ORAL_TABLET | Freq: Every day | ORAL | Status: DC
  Administered 2024-04-20: 137 ug via ORAL
  Filled 2024-04-19: qty 1

## 2024-04-19 MED ORDER — HEPARIN SODIUM (PORCINE) 1000 UNIT/ML IJ SOLN
INTRAMUSCULAR | Status: DC | PRN
  Administered 2024-04-19: 3000 [IU] via INTRAVENOUS

## 2024-04-19 MED ORDER — ONDANSETRON HCL 4 MG/2ML IJ SOLN: 4.0000 mg | Freq: Four times a day (QID) | INTRAMUSCULAR | Status: DC | PRN

## 2024-04-19 MED ORDER — HEPARIN SODIUM (PORCINE) 1000 UNIT/ML IJ SOLN
INTRAMUSCULAR | Status: DC | PRN
  Administered 2024-04-19: 2000 [IU] via INTRAVENOUS

## 2024-04-19 MED ORDER — FENTANYL CITRATE (PF) 100 MCG/2ML IJ SOLN
INTRAMUSCULAR | Status: DC | PRN
  Administered 2024-04-19: 25 ug via INTRAVENOUS

## 2024-04-19 MED ORDER — ISOSORBIDE MONONITRATE ER 30 MG PO TB24
30.0000 mg | ORAL_TABLET | Freq: Two times a day (BID) | ORAL | Status: DC
  Administered 2024-04-19 – 2024-04-20 (×2): 30 mg via ORAL
  Filled 2024-04-19 (×2): qty 1

## 2024-04-19 MED ORDER — SODIUM CHLORIDE 0.9 % IV SOLN
250.0000 mL | INTRAVENOUS | Status: DC | PRN
Start: 2024-04-19 — End: 2024-04-19

## 2024-04-19 MED ORDER — SODIUM CHLORIDE 0.9% FLUSH: 3.0000 mL | Freq: Two times a day (BID) | INTRAVENOUS | Status: DC

## 2024-04-19 MED ORDER — FENTANYL CITRATE (PF) 100 MCG/2ML IJ SOLN: 50.0000 ug | Freq: Once | INTRAMUSCULAR | Status: DC

## 2024-04-19 MED ORDER — ASPIRIN 81 MG PO TBEC
81.0000 mg | DELAYED_RELEASE_TABLET | Freq: Every day | ORAL | Status: DC
  Administered 2024-04-20: 81 mg via ORAL
  Filled 2024-04-19: qty 1

## 2024-04-19 MED ORDER — LABETALOL HCL 5 MG/ML IV SOLN: 10.0000 mg | INTRAVENOUS | Status: AC | PRN

## 2024-04-19 MED ORDER — FENTANYL CITRATE (PF) 100 MCG/2ML IJ SOLN
INTRAMUSCULAR | Status: AC
  Filled 2024-04-19: qty 2

## 2024-04-19 MED ORDER — TRAZODONE HCL 100 MG PO TABS
100.0000 mg | ORAL_TABLET | Freq: Every evening | ORAL | Status: DC | PRN
  Administered 2024-04-19: 100 mg via ORAL
  Filled 2024-04-19: qty 1

## 2024-04-19 MED ORDER — HYDRALAZINE HCL 20 MG/ML IJ SOLN: 10.0000 mg | INTRAMUSCULAR | Status: AC | PRN

## 2024-04-19 MED ORDER — NITROGLYCERIN 0.4 MG SL SUBL: 0.4000 mg | SUBLINGUAL_TABLET | SUBLINGUAL | Status: DC | PRN

## 2024-04-19 MED ORDER — RANOLAZINE ER 500 MG PO TB12
500.0000 mg | ORAL_TABLET | Freq: Two times a day (BID) | ORAL | Status: DC
  Administered 2024-04-19 – 2024-04-20 (×2): 500 mg via ORAL
  Filled 2024-04-19 (×2): qty 1

## 2024-04-19 MED ORDER — SODIUM CHLORIDE 0.9% FLUSH: 3.0000 mL | INTRAVENOUS | Status: DC | PRN

## 2024-04-19 MED ORDER — LIDOCAINE HCL 1 % IJ SOLN
INTRAMUSCULAR | Status: AC
Start: 2024-04-19 — End: 2024-04-19
  Filled 2024-04-19: qty 20

## 2024-04-19 MED ORDER — CLOPIDOGREL BISULFATE 75 MG PO TABS
75.0000 mg | ORAL_TABLET | Freq: Every day | ORAL | Status: DC
  Administered 2024-04-20: 75 mg via ORAL
  Filled 2024-04-19: qty 1

## 2024-04-19 SURGICAL SUPPLY — 22 items
BALLOON SCOREFLEX 2.75X15 (BALLOONS) IMPLANT
BALLOON TREK RX 2.5X12 (BALLOONS) IMPLANT
BALLOON ~~LOC~~ TREK NEO RX 2.75X12 (BALLOONS) IMPLANT
CATH DRAGONFLY OPSTAR (CATHETERS) IMPLANT
CATH INFINITI 5FR MULTPACK ANG (CATHETERS) IMPLANT
CATH LAUNCHER 6FR EBU 3.75 (CATHETERS) IMPLANT
DEVICE CLOSURE MYNXGRIP 6/7F (Vascular Products) ×2 IMPLANT
GUIDEWIRE PRESSURE X 175 (WIRE) IMPLANT
KIT ENCORE 26 ADVANTAGE (KITS) IMPLANT
KIT SYRINGE INJ CVI SPIKEX1 (MISCELLANEOUS) IMPLANT
NDL PERC 18GX7CM (NEEDLE) IMPLANT
NEEDLE PERC 18GX7CM (NEEDLE) ×2 IMPLANT
PACK CARDIAC CATH (CUSTOM PROCEDURE TRAY) ×2 IMPLANT
SET ATX-X65L (MISCELLANEOUS) IMPLANT
SHEATH AVANTI 5FR X 11CM (SHEATH) IMPLANT
SHEATH AVANTI 6FR X 11CM (SHEATH) IMPLANT
STATION PROTECTION PRESSURIZED (MISCELLANEOUS) IMPLANT
STENT ONYX FRONTIER 2.5X22 (Permanent Stent) IMPLANT
SUT SILK 0 FSL (SUTURE) IMPLANT
WIRE ASAHI GRAND SLAM 180CM (WIRE) IMPLANT
WIRE EMERALD 3MM-J .035X150CM (WIRE) IMPLANT
WIRE RUNTHROUGH .014X180CM (WIRE) IMPLANT

## 2024-04-19 NOTE — Brief Op Note (Signed)
 BRIEF CARDIAC CATHETERIZATION NOTE  04/19/2024  11:56 AM  PATIENT:  Jesse May  68 y.o. male  PRE-OPERATIVE DIAGNOSIS:  Unstable angina  POST-OPERATIVE DIAGNOSIS:  Same  PROCEDURE:  Procedure(s): LEFT HEART CATH AND CORONARY ANGIOGRAPHY (Left) CORONARY STENT INTERVENTION (N/A) CORONARY IMAGING/OCT (N/A)  SURGEON:  Surgeons and Role: Panel 1:    DEWAINE Fernand Denyse DELENA, MD - Primary Panel 2:    DEWAINE Mady Bruckner, MD - Primary  FINDINGS (see Dr. 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 Successful OCT-guided PCI to in-stent restenosis of proximal/mid LCx stent using Onyx Frontier 2.5 x 22 mm drug-eluting stent (post-dilated to 2.8 mm).  RECOMMENDATIONS: Remove RFA sheath with manual compression with ACT < 175 seconds. Continue DAPT with ASA and clopidogrel  for at least 6 months. Aggressive secondary prevention of CAD.  Bruckner Mady, MD [facility]

## 2024-04-19 NOTE — Plan of Care (Signed)
  Problem: Cardiovascular: Goal: Ability to achieve and maintain adequate cardiovascular perfusion will improve Outcome: Progressing   Problem: Education: Goal: Knowledge of General Education information will improve Description: Including pain rating scale, medication(s)/side effects and non-pharmacologic comfort measures Outcome: Progressing   Problem: Clinical Measurements: Goal: Will remain free from infection Outcome: Progressing   Problem: Nutrition: Goal: Adequate nutrition will be maintained Outcome: Progressing

## 2024-04-19 NOTE — Plan of Care (Signed)
  Problem: Education: Goal: Understanding of CV disease, CV risk reduction, and recovery process will improve Outcome: Progressing   Problem: Education: Goal: Understanding of CV disease, CV risk reduction, and recovery process will improve Outcome: Progressing Goal: Individualized Educational Video(s) Outcome: Progressing   Problem: Activity: Goal: Ability to return to baseline activity level will improve Outcome: Progressing   Problem: Cardiovascular: Goal: Ability to achieve and maintain adequate cardiovascular perfusion will improve Outcome: Progressing Goal: Vascular access site(s) Level 0-1 will be maintained Outcome: Progressing   Problem: Health Behavior/Discharge Planning: Goal: Ability to safely manage health-related needs after discharge will improve Outcome: Progressing   Problem: Education: Goal: Knowledge of General Education information will improve Description: Including pain rating scale, medication(s)/side effects and non-pharmacologic comfort measures Outcome: Progressing   Problem: Health Behavior/Discharge Planning: Goal: Ability to manage health-related needs will improve Outcome: Progressing   Problem: Clinical Measurements: Goal: Ability to maintain clinical measurements within normal limits will improve Outcome: Progressing Goal: Will remain free from infection Outcome: Progressing Goal: Diagnostic test results will improve Outcome: Progressing Goal: Respiratory complications will improve Outcome: Progressing Goal: Cardiovascular complication will be avoided Outcome: Progressing   Problem: Activity: Goal: Risk for activity intolerance will decrease Outcome: Progressing   Problem: Nutrition: Goal: Adequate nutrition will be maintained Outcome: Progressing   Problem: Coping: Goal: Level of anxiety will decrease Outcome: Progressing   Problem: Elimination: Goal: Will not experience complications related to bowel motility Outcome:  Progressing Goal: Will not experience complications related to urinary retention Outcome: Progressing   Problem: Pain Managment: Goal: General experience of comfort will improve and/or be controlled Outcome: Progressing   Problem: Safety: Goal: Ability to remain free from injury will improve Outcome: Progressing   Problem: Skin Integrity: Goal: Risk for impaired skin integrity will decrease Outcome: Progressing

## 2024-04-19 NOTE — Progress Notes (Addendum)
 Sheath pulled at 1615.

## 2024-04-20 ENCOUNTER — Encounter: Payer: Self-pay | Admitting: Cardiovascular Disease

## 2024-04-20 DIAGNOSIS — I25110 Atherosclerotic heart disease of native coronary artery with unstable angina pectoris: Secondary | ICD-10-CM | POA: Diagnosis not present

## 2024-04-20 DIAGNOSIS — I10 Essential (primary) hypertension: Secondary | ICD-10-CM

## 2024-04-20 DIAGNOSIS — R079 Chest pain, unspecified: Secondary | ICD-10-CM

## 2024-04-20 DIAGNOSIS — Z955 Presence of coronary angioplasty implant and graft: Secondary | ICD-10-CM

## 2024-04-20 LAB — CBC
HCT: 42 % (ref 39.0–52.0)
Hemoglobin: 14.1 g/dL (ref 13.0–17.0)
MCH: 28.3 pg (ref 26.0–34.0)
MCHC: 33.6 g/dL (ref 30.0–36.0)
MCV: 84.3 fL (ref 80.0–100.0)
Platelets: 157 10*3/uL (ref 150–400)
RBC: 4.98 MIL/uL (ref 4.22–5.81)
RDW: 15.2 % (ref 11.5–15.5)
WBC: 7.1 10*3/uL (ref 4.0–10.5)
nRBC: 0 % (ref 0.0–0.2)

## 2024-04-20 LAB — BASIC METABOLIC PANEL WITH GFR
Anion gap: 6 (ref 5–15)
BUN: 14 mg/dL (ref 8–23)
CO2: 25 mmol/L (ref 22–32)
Calcium: 8.3 mg/dL — ABNORMAL LOW (ref 8.9–10.3)
Chloride: 105 mmol/L (ref 98–111)
Creatinine, Ser: 1.11 mg/dL (ref 0.61–1.24)
GFR, Estimated: >60 mL/min (ref >60)
Glucose, Bld: 104 mg/dL — ABNORMAL HIGH (ref 70–99)
Potassium: 3.6 mmol/L (ref 3.5–5.1)
Sodium: 136 mmol/L (ref 135–145)

## 2024-04-20 NOTE — TOC CM/SW Note (Signed)
 Transition of Care [facility]) - Inpatient Brief Assessment   Patient Details  Name: Jesse May MRN: [id number] Date of Birth: 1956-08-02  Transition of [facility]) CM/SW Contact:    Lauraine JAYSON Carpen, LCSW Phone Number: 04/20/2024, 12:44 PM   Clinical Narrative: Patient has orders to discharge home today. Chart reviewed. SDOH flag for social isolation. Added resources to AVS. No other TOC needs identified. CSW signing off.  Transition of Care Asessment: Insurance and Status: Insurance coverage has been reviewed Patient has primary care physician: Yes Home environment has been reviewed: Single family home Prior level of function:: Not documented Prior/Current Home Services: No current home services Social Drivers of Health Review: SDOH reviewed interventions complete Readmission risk has been reviewed: Yes Transition of care needs: no transition of care needs at this time

## 2024-04-20 NOTE — Discharge Summary (Signed)
 Discharge Summary   Patient ID: SLATER MCMANAMAN MRN: [id number]; DOB: June 13, 1956  Admit date: 04/19/2024 Discharge date: 04/20/2024  PCP:  Carin Gauze, NP    HeartCare Providers Cardiologist:  None       Discharge Diagnoses  Principal Problem:   Coronary artery disease involving native coronary artery of native heart with unstable angina pectoris [facility]) Active Problems:   Essential hypertension   Hyperlipidemia, mixed  Diagnostic Studies/Procedures   04/19/2024 Coronary stent intervention Conclusions: 80-90% in-stent restenosis in proximal/mid LCx with diffuse in-stent restenosis observed by OCT (see Dr. Kathern diagnostic catheterization report for details of diagnostic procedure). Successful OCT-guided PCI of proximal/mid LCx in-stent restenosis with placement of overlapping Onyx Frontier 2.5 x 22 mm drug-eluting stent (postdilated to 2.8 mm) with 0% residual stenosis and TIMI-3 flow.   Recommendations: Continue dual antiplatelet therapy with aspirin  and clopidogrel  for at least 12 months. Aggressive secondary prevention of coronary artery disease. Overnight observation with anticipated discharge home tomorrow.   Intervention    04/19/2024 LHC   Ost RCA to Prox RCA lesion is 35% stenosed.   Prox LAD to Mid LAD lesion is 45% stenosed.   Prox Cx to Mid Cx lesion is 85% stenosed.   There is no aortic valve stenosis.   Recommend dual antiplatelet therapy.   Patient had cardiac catheterization without complication.  There is mild mid LAD and mild proximal RCA disease.  There is 80 to 90% restenosis of mid left circumflex stent.  Advise PCI and stenting.  Patient will be admitted overnight. _____________   History of Present Illness   Jesse May is a 68 y.o. male with history of coronary artery disease s/p DES in 2015 and PCI in 03/2023, hyperlipidemia, and essential hypertension who presented to [facility] for outpatient cardiac catheterization with possible percutaneous  intervention.   Hospital Course    Diagnostic cardiac catheterization on 04/19/2024 revealed 80 to 90% restenosis of mid left circumflex stent and mild mid LAD and mild proximal mid RCA disease.  Patient underwent successful OCT guided PCI of proximal/mid left circumflex in-stent restenosis with placement of overlapping DES.  He was continued on DAPT with aspirin  and clopidogrel  recommended for at least 12 months.  No medication changes were made.    Did the patient have an acute coronary syndrome (MI, NSTEMI, STEMI, etc) this admission?:  No                               Did the patient have a percutaneous coronary intervention (stent / angioplasty)?:  Yes.     Cath/PCI Registry Performance & Quality Measures: Aspirin  prescribed? - Yes ADP Receptor Inhibitor (Plavix /Clopidogrel , Brilinta/Ticagrelor or Effient/Prasugrel) prescribed (includes medically managed patients)? - Yes High Intensity Statin (Lipitor 40-80mg  or Crestor  20-40mg ) prescribed? - Yes For EF <40%, was ACEI/ARB prescribed? - Not Applicable (EF >/= 40%) For EF <40%, Aldosterone Antagonist (Spironolactone or Eplerenone) prescribed? - Not Applicable (EF >/= 40%) Cardiac Rehab Phase II ordered? - Yes   _____________  Discharge Vitals Blood pressure 108/68, pulse (!) 52, temperature 98.5 F (36.9 C), temperature source Oral, resp. rate 16, height 6' 2 (1.88 m), weight 103.9 kg, SpO2 97%.  Filed Weights   04/19/24 0810  Weight: 103.9 kg    Labs & Radiologic Studies  CBC Recent Labs    04/20/24 0358  WBC 7.1  HGB 14.1  HCT 42.0  MCV 84.3  PLT 157   Basic Metabolic Panel  Recent Labs    04/20/24 0358  NA 136  K 3.6  CL 105  CO2 25  GLUCOSE 104*  BUN 14  CREATININE 1.11  CALCIUM  8.3*   Liver Function Tests No results for input(s): AST, ALT, ALKPHOS, BILITOT, PROT, ALBUMIN in the last 72 hours. No results for input(s): LIPASE, AMYLASE in the last 72 hours. High Sensitivity Troponin:   No  results for input(s): TROPONINIHS in the last 720 hours.  No results for input(s): TRNPT in the last 720 hours.  BNP Invalid input(s): POCBNP No results for input(s): PROBNP in the last 72 hours.  No results for input(s): BNP in the last 72 hours.  D-Dimer No results for input(s): DDIMER in the last 72 hours. Hemoglobin A1C No results for input(s): HGBA1C in the last 72 hours. Fasting Lipid Panel No results for input(s): CHOL, HDL, LDLCALC, TRIG, CHOLHDL, LDLDIRECT in the last 72 hours. Lipoprotein (a)  Date/Time Value Ref Range Status  03/27/2023 05:36 AM 78.8 (H) <75.0 nmol/L Final    Comment:    (NOTE) Note:  Values greater than or equal to 75.0 nmol/L may       indicate an independent risk factor for CHD,       but must be evaluated with caution when applied       to non-Caucasian populations due to the       influence of genetic factors on Lp(a) across       ethnicities. Performed At: [facility] [address] [place], [place] [id number] Jennette Shorter MD Ey:[phone]     Disposition Pt is being discharged home today in good condition. R groin cath site is stable without sign of bleeding and hematoma.   Follow-up Plans & Appointments  Discharge Instructions     AMB Referral to Cardiac Rehabilitation - Phase II   Complete by: As directed    Diagnosis: Coronary Stents   After initial evaluation and assessments completed: Virtual Based Care may be provided alone or in conjunction with Phase 2 Cardiac Rehab based on patient barriers.: Yes   Intensive Cardiac Rehabilitation (ICR) [facility] location only OR Traditional Cardiac Rehabilitation (TCR) *If criteria for ICR are not met will enroll in TCR [facility] only): Yes   Call MD for:  difficulty breathing, headache or visual disturbances   Complete by: As directed    Call MD for:  persistant nausea and vomiting   Complete by: As directed    Call MD for:  redness, tenderness, or signs of infection  (pain, swelling, redness, odor or green/yellow discharge around incision site)   Complete by: As directed    Call MD for:  severe uncontrolled pain   Complete by: As directed    Call MD for:  temperature >100.4   Complete by: As directed    Diet - low sodium heart healthy   Complete by: As directed    Discharge instructions   Complete by: As directed    No driving for 1 week. No lifting over 10 lbs for 2 weeks. No sexual activity for 2 weeks. Keep procedure site clean & dry. If you notice increased pain, swelling, bleeding or pus, call/return!  You may shower, but no soaking baths/hot tubs/pools for 1 week.   Increase activity slowly   Complete by: As directed       Patient has outpatient follow up scheduled with Dr. Fernand.   Discharge Medications Allergies as of 04/20/2024   No Known Allergies      Medication List  TAKE these medications    amLODipine  10 MG tablet Commonly known as: NORVASC  Take 1 tablet (10 mg total) by mouth daily.   Aspirin  81 MG Caps Take 81 mg by mouth daily.   clopidogrel  75 MG tablet Commonly known as: PLAVIX  Take 75 mg by mouth daily.   esomeprazole  40 MG capsule Commonly known as: NEXIUM  Take 1 capsule by mouth once daily   furosemide  20 MG tablet Commonly known as: Lasix  Take 1 tablet (20 mg total) by mouth daily.   isosorbide  mononitrate 30 MG 24 hr tablet Commonly known as: IMDUR  Take 1 tablet (30 mg total) by mouth 2 (two) times daily.   ketorolac  10 MG tablet Commonly known as: TORADOL  Take 1 tablet (10 mg total) by mouth every 6 (six) hours as needed.   levothyroxine  137 MCG tablet Commonly known as: SYNTHROID  Take 1 tablet (137 mcg total) by mouth daily before breakfast.   losartan  100 MG tablet Commonly known as: Cozaar  Take 1 tablet (100 mg total) by mouth daily.   metoprolol  succinate 50 MG 24 hr tablet Commonly known as: Toprol  XL Take 1 tablet (50 mg total) by mouth daily. Take with or immediately following a  meal. What changed: when to take this   nitroGLYCERIN  0.4 MG SL tablet Commonly known as: Nitrostat  Place 1 tablet (0.4 mg total) under the tongue every 5 (five) minutes as needed for chest pain.   pantoprazole  40 MG tablet Commonly known as: PROTONIX  Take 40 mg by mouth daily as needed (acid reflux).   potassium chloride  10 MEQ tablet Commonly known as: KLOR-CON  Take 1 tablet by mouth twice daily   ranolazine  500 MG 12 hr tablet Commonly known as: Ranexa  Take 1 tablet (500 mg total) by mouth 2 (two) times daily.   rosuvastatin  40 MG tablet Commonly known as: CRESTOR  Take 40 mg by mouth daily.   traZODone 100 MG tablet Commonly known as: DESYREL TAKE 1 TABLET BY MOUTH NIGHTLY AT BEDTIME AS NEEDED FOR SLEEP (NOTE  INCREASED  DOSAGE)        Duration of Discharge Encounter: APP Time: 25 minutes   Signed, Lesley LITTIE Maffucci, PA-C 04/20/2024, 12:38 PM

## 2024-04-20 NOTE — Care Management Obs Status (Signed)
 MEDICARE OBSERVATION STATUS NOTIFICATION   Patient Details  Name: ROBBERT LANGLINAIS MRN: [id number] Date of Birth: 1956/03/14   Medicare Observation Status Notification Given:   (patient did not want a copy)    [name] 04/20/2024, 1:40 PM

## 2024-04-20 NOTE — Care Management Important Message (Signed)
 Important Message  Patient Details  Name: Jesse May MRN: [id number] Date of Birth: [birth year]   Important Message Given:  Yes - Medicare IM     [name] 04/20/2024, 11:57 AM

## 2024-04-20 NOTE — Care Management Obs Status (Signed)
 MEDICARE OBSERVATION STATUS NOTIFICATION   Patient Details  Name: AHMAR PICKRELL MRN: [id number] Date of Birth: 06/11/1956   Medicare Observation Status Notification Given:   (patient did not want a copy)    [name] 04/20/2024, 11:53 AM

## 2024-04-20 NOTE — Discharge Instructions (Signed)

## 2024-04-20 NOTE — Care Management Obs Status (Signed)
 MEDICARE OBSERVATION STATUS NOTIFICATION   Patient Details  Name: Jesse May MRN: [id number] Date of Birth: 09/16/1956   Medicare Observation Status Notification Given:   (patient did not want a copy)    [name] 04/20/2024, 11:47 AM

## 2024-04-21 ENCOUNTER — Ambulatory Visit (INDEPENDENT_AMBULATORY_CARE_PROVIDER_SITE_OTHER): Admitting: Cardiovascular Disease

## 2024-04-21 ENCOUNTER — Encounter: Payer: Self-pay | Admitting: Cardiovascular Disease

## 2024-04-21 VITALS — BP 160/86 | HR 63 | Ht 74.0 in | Wt 233.4 lb

## 2024-04-21 DIAGNOSIS — E782 Mixed hyperlipidemia: Secondary | ICD-10-CM

## 2024-04-21 DIAGNOSIS — Z9861 Coronary angioplasty status: Secondary | ICD-10-CM

## 2024-04-21 DIAGNOSIS — R0789 Other chest pain: Secondary | ICD-10-CM | POA: Diagnosis not present

## 2024-04-21 DIAGNOSIS — R0602 Shortness of breath: Secondary | ICD-10-CM

## 2024-04-21 DIAGNOSIS — I200 Unstable angina: Secondary | ICD-10-CM

## 2024-04-21 DIAGNOSIS — I10 Essential (primary) hypertension: Secondary | ICD-10-CM

## 2024-04-21 MED ORDER — RANOLAZINE ER 1000 MG PO TB12: 1000.0000 mg | ORAL_TABLET | Freq: Two times a day (BID) | ORAL | 2 refills | Status: AC

## 2024-04-21 NOTE — Progress Notes (Signed)
 Cardiology Office Note   Date:  04/21/2024   ID:  Jesse May, DOB 1956/01/01, MRN [id number]  PCP:  Carin Gauze, NP  Cardiologist:  Denyse Bathe, MD      History of Present Illness: Jesse May is a 68 y.o. male who presents for  Chief Complaint  Patient presents with   Follow-up    Cath follow up    Still has occasional chest pain.      Past Medical History:  Diagnosis Date   MI (myocardial infarction) [facility])      Past Surgical History:  Procedure Laterality Date   APPENDECTOMY     CARDIAC CATHETERIZATION  2015   stent left circumflex-per Dr. KATHEE. [facility] note Care Everywhere   CHOLECYSTECTOMY     CORONARY BALLOON ANGIOPLASTY N/A 03/26/2023   Procedure: CORONARY BALLOON ANGIOPLASTY;  Surgeon: Anner Alm ORN, MD;  Location: [facility] INVASIVE CV LAB;  Service: Cardiovascular;  Laterality: N/A;   CORONARY IMAGING/OCT N/A 04/19/2024   Procedure: CORONARY IMAGING/OCT;  Surgeon: Mady Bruckner, MD;  Location: [facility] INVASIVE CV LAB;  Service: Cardiovascular;  Laterality: N/A;   CORONARY STENT INTERVENTION N/A 04/19/2024   Procedure: CORONARY STENT INTERVENTION;  Surgeon: Mady Bruckner, MD;  Location: [facility] INVASIVE CV LAB;  Service: Cardiovascular;  Laterality: N/A;   LEFT HEART CATH AND CORONARY ANGIOGRAPHY Left 03/26/2023   Procedure: LEFT HEART CATH AND CORONARY ANGIOGRAPHY;  Surgeon: Bathe Denyse DELENA, MD;  Location: [facility] INVASIVE CV LAB;  Service: Cardiovascular;  Laterality: Left;   LEFT HEART CATH AND CORONARY ANGIOGRAPHY Left 04/19/2024   Procedure: LEFT HEART CATH AND CORONARY ANGIOGRAPHY;  Surgeon: Bathe Denyse DELENA, MD;  Location: [facility] INVASIVE CV LAB;  Service: Cardiovascular;  Laterality: Left;   TONSILLECTOMY       Current Outpatient Medications  Medication Sig Dispense Refill   amLODipine  (NORVASC ) 10 MG tablet Take 1 tablet (10 mg total) by mouth daily. 30 tablet 11   Aspirin  81 MG CAPS Take 81 mg by mouth daily.     clopidogrel  (PLAVIX ) 75 MG tablet Take 75  mg by mouth daily.     esomeprazole  (NEXIUM ) 40 MG capsule Take 1 capsule by mouth once daily 60 capsule 0   furosemide  (LASIX ) 20 MG tablet Take 1 tablet (20 mg total) by mouth daily. 30 tablet 11   isosorbide  mononitrate (IMDUR ) 30 MG 24 hr tablet Take 1 tablet (30 mg total) by mouth 2 (two) times daily. 30 tablet 1   ketorolac  (TORADOL ) 10 MG tablet Take 1 tablet (10 mg total) by mouth every 6 (six) hours as needed. 20 tablet 0   levothyroxine  (SYNTHROID ) 137 MCG tablet Take 1 tablet (137 mcg total) by mouth daily before breakfast. 90 tablet 2   losartan  (COZAAR ) 100 MG tablet Take 1 tablet (100 mg total) by mouth daily. 30 tablet 11   metoprolol  succinate (TOPROL  XL) 50 MG 24 hr tablet Take 1 tablet (50 mg total) by mouth daily. Take with or immediately following a meal. 30 tablet 11   nitroGLYCERIN  (NITROSTAT ) 0.4 MG SL tablet Place 1 tablet (0.4 mg total) under the tongue every 5 (five) minutes as needed for chest pain. 100 tablet 3   pantoprazole  (PROTONIX ) 40 MG tablet Take 40 mg by mouth daily as needed (acid reflux).     potassium chloride  (KLOR-CON ) 10 MEQ tablet Take 1 tablet by mouth twice daily 30 tablet 0   ranolazine  (RANEXA ) 1000 MG SR tablet Take 1 tablet (1,000 mg total) by mouth 2 (  two) times daily. 60 tablet 2   rosuvastatin  (CRESTOR ) 40 MG tablet Take 40 mg by mouth daily.     traZODone (DESYREL) 100 MG tablet TAKE 1 TABLET BY MOUTH NIGHTLY AT BEDTIME AS NEEDED FOR SLEEP (NOTE  INCREASED  DOSAGE) 90 tablet 0   No current facility-administered medications for this visit.    Allergies:   Patient has no known allergies.    Social History:   reports that he has never smoked. He has never used smokeless tobacco. He reports that he does not currently use alcohol. He reports that he does not currently use drugs.   Family History:  family history is not on file.    ROS:     Review of Systems  Constitutional: Negative.   HENT: Negative.    Eyes: Negative.   Respiratory:  Negative.    Gastrointestinal: Negative.   Genitourinary: Negative.   Musculoskeletal: Negative.   Skin: Negative.   Neurological: Negative.   Endo/Heme/Allergies: Negative.   Psychiatric/Behavioral: Negative.    All other systems reviewed and are negative.     All other systems are reviewed and negative.    PHYSICAL EXAM: VS:  BP (!) 160/86   Pulse 63   Ht 6' 2 (1.88 m)   Wt 233 lb 6.4 oz (105.9 kg)   SpO2 95%   BMI 29.97 kg/m  , BMI Body mass index is 29.97 kg/m. Last weight:  Wt Readings from Last 3 Encounters:  04/21/24 233 lb 6.4 oz (105.9 kg)  04/19/24 229 lb (103.9 kg)  04/12/24 227 lb 6.4 oz (103.1 kg)     Physical Exam Vitals reviewed.  Constitutional:      Appearance: Normal appearance. He is normal weight.  HENT:     Head: Normocephalic.     Nose: Nose normal.     Mouth/Throat:     Mouth: Mucous membranes are moist.  Eyes:     Pupils: Pupils are equal, round, and reactive to light.  Cardiovascular:     Rate and Rhythm: Normal rate and regular rhythm.     Pulses: Normal pulses.     Heart sounds: Normal heart sounds.  Pulmonary:     Effort: Pulmonary effort is normal.  Abdominal:     General: Abdomen is flat. Bowel sounds are normal.  Musculoskeletal:        General: Normal range of motion.     Cervical back: Normal range of motion.  Skin:    General: Skin is warm.  Neurological:     General: No focal deficit present.     Mental Status: He is alert.  Psychiatric:        Mood and Affect: Mood normal.       EKG: NSR no acute changes  Recent Labs: 02/09/2024: TSH 5.840 04/11/2024: ALT 19 04/20/2024: BUN 14; Creatinine, Ser 1.11; Hemoglobin 14.1; Platelets 157; Potassium 3.6; Sodium 136    Lipid Panel    Component Value Date/Time   CHOL 118 02/09/2024 1217   TRIG 58 02/09/2024 1217   HDL 36 (L) 02/09/2024 1217   CHOLHDL 3.3 02/09/2024 1217   LDLCALC 69 02/09/2024 1217      Other studies Reviewed: Additional studies/ records that  were reviewed today include:  Review of the above records demonstrates:       No data to display            ASSESSMENT AND PLAN:    ICD-10-CM   1. History of PTCA  Z98.61 ranolazine  (RANEXA ) 1000  MG SR tablet   Had PCI of LCX , and did well. Still has chest pain.  Will do EKG.    2. Hyperlipidemia, mixed  E78.2 ranolazine  (RANEXA ) 1000 MG SR tablet    3. Essential hypertension  I10 ranolazine  (RANEXA ) 1000 MG SR tablet    4. Other chest pain  R07.89 ranolazine  (RANEXA ) 1000 MG SR tablet    5. Unstable angina pectoris (HCC)  I20.0 ranolazine  (RANEXA ) 1000 MG SR tablet    6. SOB (shortness of breath)  R06.02 ranolazine  (RANEXA ) 1000 MG SR tablet       Problem List Items Addressed This Visit       Cardiovascular and Mediastinum   Essential hypertension (Chronic)   Relevant Medications   ranolazine  (RANEXA ) 1000 MG SR tablet     Other   Hyperlipidemia, mixed (Chronic)   Relevant Medications   ranolazine  (RANEXA ) 1000 MG SR tablet   SOB (shortness of breath)   Relevant Medications   ranolazine  (RANEXA ) 1000 MG SR tablet   Other chest pain   Relevant Medications   ranolazine  (RANEXA ) 1000 MG SR tablet   Other Visit Diagnoses       History of PTCA    -  Primary   Had PCI of LCX , and did well. Still has chest pain.  Will do EKG.   Relevant Medications   ranolazine  (RANEXA ) 1000 MG SR tablet     Unstable angina pectoris (HCC)       Relevant Medications   ranolazine  (RANEXA ) 1000 MG SR tablet          Disposition:   Return in about 4 weeks (around 05/19/2024).    Total time spent: 30 minutes  Signed,  Denyse Bathe, MD  04/21/2024 11:43 AM    [facility]

## 2024-04-25 ENCOUNTER — Ambulatory Visit: Admitting: Cardiovascular Disease

## 2024-04-25 ENCOUNTER — Encounter: Payer: Self-pay | Admitting: Cardiovascular Disease

## 2024-04-26 ENCOUNTER — Encounter: Payer: Self-pay | Admitting: Cardiovascular Disease

## 2024-05-01 ENCOUNTER — Other Ambulatory Visit: Payer: Self-pay | Admitting: Cardiovascular Disease

## 2024-05-02 ENCOUNTER — Other Ambulatory Visit: Payer: Self-pay | Admitting: *Deleted

## 2024-05-03 ENCOUNTER — Other Ambulatory Visit: Payer: Self-pay | Admitting: Cardiology

## 2024-05-12 ENCOUNTER — Encounter: Payer: Self-pay | Admitting: Cardiovascular Disease

## 2024-05-12 ENCOUNTER — Ambulatory Visit (INDEPENDENT_AMBULATORY_CARE_PROVIDER_SITE_OTHER): Admitting: Cardiovascular Disease

## 2024-05-12 VITALS — BP 140/80 | HR 69 | Ht 74.0 in | Wt 231.4 lb

## 2024-05-12 DIAGNOSIS — E782 Mixed hyperlipidemia: Secondary | ICD-10-CM | POA: Diagnosis not present

## 2024-05-12 DIAGNOSIS — Z9861 Coronary angioplasty status: Secondary | ICD-10-CM

## 2024-05-12 DIAGNOSIS — R0602 Shortness of breath: Secondary | ICD-10-CM

## 2024-05-12 DIAGNOSIS — I10 Essential (primary) hypertension: Secondary | ICD-10-CM

## 2024-05-12 DIAGNOSIS — Z955 Presence of coronary angioplasty implant and graft: Secondary | ICD-10-CM

## 2024-05-12 MED ORDER — LISINOPRIL 20 MG PO TABS: 20.0000 mg | ORAL_TABLET | Freq: Every day | ORAL | 11 refills | Status: DC

## 2024-05-12 NOTE — Progress Notes (Signed)
 Cardiology Office Note   Date:  05/12/2024   ID:  Jesse May, DOB 02/28/1956, MRN [id number]  PCP:  Carin Gauze, NP  Cardiologist:  Denyse Bathe, MD      History of Present Illness: Jesse May is a 68 y.o. male who presents for  Chief Complaint  Patient presents with   Follow-up    1 month    Feeling fine, no chest pain or SOB even with exertion and exercisee.      Past Medical History:  Diagnosis Date   MI (myocardial infarction) [facility])      Past Surgical History:  Procedure Laterality Date   APPENDECTOMY     CARDIAC CATHETERIZATION  2015   stent left circumflex-per Dr. KATHEE. [facility] note Care Everywhere   CHOLECYSTECTOMY     CORONARY BALLOON ANGIOPLASTY N/A 03/26/2023   Procedure: CORONARY BALLOON ANGIOPLASTY;  Surgeon: Anner Alm ORN, MD;  Location: [facility] INVASIVE CV LAB;  Service: Cardiovascular;  Laterality: N/A;   CORONARY IMAGING/OCT N/A 04/19/2024   Procedure: CORONARY IMAGING/OCT;  Surgeon: Mady Bruckner, MD;  Location: [facility] INVASIVE CV LAB;  Service: Cardiovascular;  Laterality: N/A;   CORONARY STENT INTERVENTION N/A 04/19/2024   Procedure: CORONARY STENT INTERVENTION;  Surgeon: Mady Bruckner, MD;  Location: [facility] INVASIVE CV LAB;  Service: Cardiovascular;  Laterality: N/A;   LEFT HEART CATH AND CORONARY ANGIOGRAPHY Left 03/26/2023   Procedure: LEFT HEART CATH AND CORONARY ANGIOGRAPHY;  Surgeon: Bathe Denyse DELENA, MD;  Location: [facility] INVASIVE CV LAB;  Service: Cardiovascular;  Laterality: Left;   LEFT HEART CATH AND CORONARY ANGIOGRAPHY Left 04/19/2024   Procedure: LEFT HEART CATH AND CORONARY ANGIOGRAPHY;  Surgeon: Bathe Denyse DELENA, MD;  Location: [facility] INVASIVE CV LAB;  Service: Cardiovascular;  Laterality: Left;   TONSILLECTOMY       Current Outpatient Medications  Medication Sig Dispense Refill   amLODipine  (NORVASC ) 10 MG tablet Take 1 tablet (10 mg total) by mouth daily. 30 tablet 11   Aspirin  81 MG CAPS Take 81 mg by mouth daily.     clopidogrel   (PLAVIX ) 75 MG tablet Take 75 mg by mouth daily.     esomeprazole  (NEXIUM ) 40 MG capsule Take 1 capsule by mouth once daily 60 capsule 0   furosemide  (LASIX ) 20 MG tablet Take 1 tablet (20 mg total) by mouth daily. 30 tablet 11   isosorbide  mononitrate (IMDUR ) 30 MG 24 hr tablet Take 1 tablet (30 mg total) by mouth 2 (two) times daily. 30 tablet 1   ketorolac  (TORADOL ) 10 MG tablet Take 1 tablet (10 mg total) by mouth every 6 (six) hours as needed. 20 tablet 0   levothyroxine  (SYNTHROID ) 137 MCG tablet Take 1 tablet (137 mcg total) by mouth daily before breakfast. 90 tablet 2   lisinopril  (ZESTRIL ) 20 MG tablet Take 1 tablet (20 mg total) by mouth daily. 30 tablet 11   metoprolol  succinate (TOPROL  XL) 50 MG 24 hr tablet Take 1 tablet (50 mg total) by mouth daily. Take with or immediately following a meal. 30 tablet 11   nitroGLYCERIN  (NITROSTAT ) 0.4 MG SL tablet DISSOLVE ONE TABLET UNDER THE TONGUE EVERY 5 MINUTES AS NEEDED FOR CHEST PAIN.  DO NOT EXCEED A TOTAL OF 3 DOSES IN 15 MINUTES 100 tablet 0   pantoprazole  (PROTONIX ) 40 MG tablet Take 40 mg by mouth daily as needed (acid reflux).     potassium chloride  (KLOR-CON ) 10 MEQ tablet Take 1 tablet by mouth twice daily 30 tablet 0   ranolazine  (  RANEXA ) 1000 MG SR tablet Take 1 tablet (1,000 mg total) by mouth 2 (two) times daily. 60 tablet 2   rosuvastatin  (CRESTOR ) 40 MG tablet Take 40 mg by mouth daily.     traZODone  (DESYREL ) 100 MG tablet TAKE 1 TABLET BY MOUTH NIGHTLY AT BEDTIME AS NEEDED FOR SLEEP (NOTE  INCREASED  DOSAGE) 90 tablet 0   No current facility-administered medications for this visit.    Allergies:   Patient has no known allergies.    Social History:   reports that he has never smoked. He has never used smokeless tobacco. He reports that he does not currently use alcohol. He reports that he does not currently use drugs.   Family History:  family history is not on file.    ROS:     Review of Systems  Constitutional:  Negative.   HENT: Negative.    Eyes: Negative.   Respiratory: Negative.    Gastrointestinal: Negative.   Genitourinary: Negative.   Musculoskeletal: Negative.   Skin: Negative.   Neurological: Negative.   Endo/Heme/Allergies: Negative.   Psychiatric/Behavioral: Negative.    All other systems reviewed and are negative.     All other systems are reviewed and negative.    PHYSICAL EXAM: VS:  BP (!) 140/80   Pulse 69   Ht 6' 2 (1.88 m)   Wt 231 lb 6.4 oz (105 kg)   SpO2 97%   BMI 29.71 kg/m  , BMI Body mass index is 29.71 kg/m. Last weight:  Wt Readings from Last 3 Encounters:  05/12/24 231 lb 6.4 oz (105 kg)  04/21/24 233 lb 6.4 oz (105.9 kg)  04/19/24 229 lb (103.9 kg)     Physical Exam Vitals reviewed.  Constitutional:      Appearance: Normal appearance. He is normal weight.  HENT:     Head: Normocephalic.     Nose: Nose normal.     Mouth/Throat:     Mouth: Mucous membranes are moist.  Eyes:     Pupils: Pupils are equal, round, and reactive to light.  Cardiovascular:     Rate and Rhythm: Normal rate and regular rhythm.     Pulses: Normal pulses.     Heart sounds: Normal heart sounds.  Pulmonary:     Effort: Pulmonary effort is normal.  Abdominal:     General: Abdomen is flat. Bowel sounds are normal.  Musculoskeletal:        General: Normal range of motion.     Cervical back: Normal range of motion.  Skin:    General: Skin is warm.  Neurological:     General: No focal deficit present.     Mental Status: He is alert.  Psychiatric:        Mood and Affect: Mood normal.       EKG:   Recent Labs: 02/09/2024: TSH 5.840 04/11/2024: ALT 19 04/20/2024: BUN 14; Creatinine, Ser 1.11; Hemoglobin 14.1; Platelets 157; Potassium 3.6; Sodium 136    Lipid Panel    Component Value Date/Time   CHOL 118 02/09/2024 1217   TRIG 58 02/09/2024 1217   HDL 36 (L) 02/09/2024 1217   CHOLHDL 3.3 02/09/2024 1217   LDLCALC 69 02/09/2024 1217      Other studies  Reviewed: Additional studies/ records that were reviewed today include:  Review of the above records demonstrates:       No data to display            ASSESSMENT AND PLAN:    ICD-10-CM  1. History of PTCA  Z98.61 lisinopril  (ZESTRIL ) 20 MG tablet   No chest pains.    2. Hyperlipidemia, mixed  E78.2 lisinopril  (ZESTRIL ) 20 MG tablet    3. Essential hypertension  I10 lisinopril  (ZESTRIL ) 20 MG tablet   says had better BP on lisinopril  20 instead of losartan , so will change that.    4. SOB (shortness of breath)  R06.02 lisinopril  (ZESTRIL ) 20 MG tablet    5. 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   Relevant Medications   lisinopril  (ZESTRIL ) 20 MG tablet          Disposition:   Return in about 4 weeks (around 06/09/2024).    Total time spent: 30 minutes  Signed,  Denyse Bathe, MD  05/12/2024 1:50 PM    [facility]

## 2024-05-19 ENCOUNTER — Ambulatory Visit: Admitting: Cardiovascular Disease

## 2024-06-06 ENCOUNTER — Other Ambulatory Visit: Payer: Self-pay | Admitting: Cardiology

## 2024-06-13 ENCOUNTER — Ambulatory Visit: Admitting: Cardiology

## 2024-06-16 ENCOUNTER — Ambulatory Visit: Admitting: Cardiovascular Disease

## 2024-06-17 ENCOUNTER — Ambulatory Visit: Admitting: Cardiology

## 2024-06-17 ENCOUNTER — Other Ambulatory Visit

## 2024-06-17 DIAGNOSIS — E039 Hypothyroidism, unspecified: Secondary | ICD-10-CM

## 2024-06-17 DIAGNOSIS — R5383 Other fatigue: Secondary | ICD-10-CM

## 2024-06-17 DIAGNOSIS — E782 Mixed hyperlipidemia: Secondary | ICD-10-CM

## 2024-06-17 DIAGNOSIS — R7301 Impaired fasting glucose: Secondary | ICD-10-CM

## 2024-06-17 DIAGNOSIS — I10 Essential (primary) hypertension: Secondary | ICD-10-CM

## 2024-06-22 ENCOUNTER — Ambulatory Visit: Payer: Self-pay | Admitting: Cardiology

## 2024-06-22 LAB — CBC WITH DIFFERENTIAL/PLATELET

## 2024-06-22 LAB — CMP14+EGFR
ALT: 14 IU/L (ref 0–44)
AST: 18 IU/L (ref 0–40)
Albumin: 4.6 g/dL (ref 3.9–4.9)
Alkaline Phosphatase: 70 IU/L (ref 44–121)
BUN/Creatinine Ratio: 15 (ref 10–24)
BUN: 18 mg/dL (ref 8–27)
Bilirubin Total: 0.5 mg/dL (ref 0.0–1.2)
CO2: 16 mmol/L — ABNORMAL LOW (ref 20–29)
Calcium: 9.2 mg/dL (ref 8.6–10.2)
Chloride: 101 mmol/L (ref 96–106)
Creatinine, Ser: 1.19 mg/dL (ref 0.76–1.27)
Globulin, Total: 1.7 g/dL (ref 1.5–4.5)
Glucose: 100 mg/dL — ABNORMAL HIGH (ref 70–99)
Potassium: 4.1 mmol/L (ref 3.5–5.2)
Sodium: 139 mmol/L (ref 134–144)
Total Protein: 6.3 g/dL (ref 6.0–8.5)
eGFR: 66 mL/min/1.73 (ref >59)

## 2024-06-22 LAB — LIPID PANEL
Chol/HDL Ratio: 3.8 ratio (ref 0.0–5.0)
Cholesterol, Total: 150 mg/dL (ref 100–199)
HDL: 39 mg/dL — ABNORMAL LOW (ref >39)
LDL Chol Calc (NIH): 96 mg/dL (ref 0–99)
Triglycerides: 75 mg/dL (ref 0–149)
VLDL Cholesterol Cal: 15 mg/dL (ref 5–40)

## 2024-06-22 LAB — HEMOGLOBIN A1C
Est. average glucose Bld gHb Est-mCnc: 111 mg/dL
Hgb A1c MFr Bld: 5.5 % (ref 4.8–5.6)

## 2024-06-22 LAB — TSH: TSH: 7.33 u[IU]/mL — ABNORMAL HIGH (ref 0.450–4.500)

## 2024-06-22 LAB — TESTOSTERONE,FREE AND TOTAL
Testosterone, Free: 32.6 pg/mL — ABNORMAL HIGH (ref 6.6–18.1)
Testosterone: >1500 ng/dL — ABNORMAL HIGH (ref 264–916)

## 2024-06-23 ENCOUNTER — Ambulatory Visit (INDEPENDENT_AMBULATORY_CARE_PROVIDER_SITE_OTHER): Admitting: Cardiovascular Disease

## 2024-06-23 ENCOUNTER — Encounter: Payer: Self-pay | Admitting: Cardiovascular Disease

## 2024-06-23 VITALS — BP 172/88 | HR 65 | Ht 74.0 in | Wt 230.0 lb

## 2024-06-23 DIAGNOSIS — Z9861 Coronary angioplasty status: Secondary | ICD-10-CM

## 2024-06-23 DIAGNOSIS — G4733 Obstructive sleep apnea (adult) (pediatric): Secondary | ICD-10-CM | POA: Diagnosis not present

## 2024-06-23 DIAGNOSIS — R0602 Shortness of breath: Secondary | ICD-10-CM | POA: Diagnosis not present

## 2024-06-23 DIAGNOSIS — I10 Essential (primary) hypertension: Secondary | ICD-10-CM

## 2024-06-23 DIAGNOSIS — E782 Mixed hyperlipidemia: Secondary | ICD-10-CM

## 2024-06-23 MED ORDER — LISINOPRIL 30 MG PO TABS: 30.0000 mg | ORAL_TABLET | Freq: Every day | ORAL | 11 refills | Status: DC

## 2024-06-23 MED ORDER — ROSUVASTATIN CALCIUM 40 MG PO TABS: 40.0000 mg | ORAL_TABLET | Freq: Every day | ORAL | 2 refills | Status: AC

## 2024-06-23 NOTE — Progress Notes (Signed)
 Cardiology Office Note   Date:  06/23/2024   ID:  Jesse May, DOB 14-May-1956, MRN [id number]  PCP:  Carin Gauze, NP  Cardiologist:  Denyse Bathe, MD      History of Present Illness: Jesse May is a 68 y.o. male who presents for  Chief Complaint  Patient presents with   Follow-up    4 week follow up    No chest pain but is SOB.      Past Medical History:  Diagnosis Date   MI (myocardial infarction) [facility])      Past Surgical History:  Procedure Laterality Date   APPENDECTOMY     CARDIAC CATHETERIZATION  2015   stent left circumflex-per Dr. KATHEE. [facility] note Care Everywhere   CHOLECYSTECTOMY     CORONARY BALLOON ANGIOPLASTY N/A 03/26/2023   Procedure: CORONARY BALLOON ANGIOPLASTY;  Surgeon: Anner Alm ORN, MD;  Location: [facility] INVASIVE CV LAB;  Service: Cardiovascular;  Laterality: N/A;   CORONARY IMAGING/OCT N/A 04/19/2024   Procedure: CORONARY IMAGING/OCT;  Surgeon: Mady Bruckner, MD;  Location: [facility] INVASIVE CV LAB;  Service: Cardiovascular;  Laterality: N/A;   CORONARY STENT INTERVENTION N/A 04/19/2024   Procedure: CORONARY STENT INTERVENTION;  Surgeon: Mady Bruckner, MD;  Location: [facility] INVASIVE CV LAB;  Service: Cardiovascular;  Laterality: N/A;   LEFT HEART CATH AND CORONARY ANGIOGRAPHY Left 03/26/2023   Procedure: LEFT HEART CATH AND CORONARY ANGIOGRAPHY;  Surgeon: Bathe Denyse DELENA, MD;  Location: [facility] INVASIVE CV LAB;  Service: Cardiovascular;  Laterality: Left;   LEFT HEART CATH AND CORONARY ANGIOGRAPHY Left 04/19/2024   Procedure: LEFT HEART CATH AND CORONARY ANGIOGRAPHY;  Surgeon: Bathe Denyse DELENA, MD;  Location: [facility] INVASIVE CV LAB;  Service: Cardiovascular;  Laterality: Left;   TONSILLECTOMY       Current Outpatient Medications  Medication Sig Dispense Refill   amLODipine  (NORVASC ) 10 MG tablet Take 1 tablet (10 mg total) by mouth daily. 30 tablet 11   Aspirin  81 MG CAPS Take 81 mg by mouth daily.     clopidogrel  (PLAVIX ) 75 MG tablet Take 75 mg  by mouth daily.     esomeprazole  (NEXIUM ) 40 MG capsule Take 1 capsule by mouth once daily 60 capsule 0   furosemide  (LASIX ) 20 MG tablet Take 1 tablet (20 mg total) by mouth daily. 30 tablet 11   isosorbide  mononitrate (IMDUR ) 30 MG 24 hr tablet Take 1 tablet (30 mg total) by mouth 2 (two) times daily. 30 tablet 1   ketorolac  (TORADOL ) 10 MG tablet Take 1 tablet (10 mg total) by mouth every 6 (six) hours as needed. 20 tablet 0   levothyroxine  (SYNTHROID ) 137 MCG tablet TAKE 1 TABLET BY MOUTH DAILY BEFORE BREAKFAST 90 tablet 0   lisinopril  (ZESTRIL ) 30 MG tablet Take 1 tablet (30 mg total) by mouth daily. 30 tablet 11   metoprolol  succinate (TOPROL  XL) 50 MG 24 hr tablet Take 1 tablet (50 mg total) by mouth daily. Take with or immediately following a meal. 30 tablet 11   nitroGLYCERIN  (NITROSTAT ) 0.4 MG SL tablet DISSOLVE ONE TABLET UNDER THE TONGUE EVERY 5 MINUTES AS NEEDED FOR CHEST PAIN.  DO NOT EXCEED A TOTAL OF 3 DOSES IN 15 MINUTES 100 tablet 0   pantoprazole  (PROTONIX ) 40 MG tablet Take 40 mg by mouth daily as needed (acid reflux).     potassium chloride  (KLOR-CON ) 10 MEQ tablet Take 1 tablet by mouth twice daily 30 tablet 0   ranolazine  (RANEXA ) 1000 MG SR tablet Take 1  tablet (1,000 mg total) by mouth 2 (two) times daily. 60 tablet 2   traZODone  (DESYREL ) 100 MG tablet TAKE 1 TABLET BY MOUTH NIGHTLY AT BEDTIME AS NEEDED FOR SLEEP (NOTE  INCREASED  DOSAGE) 90 tablet 0   rosuvastatin  (CRESTOR ) 40 MG tablet Take 1 tablet (40 mg total) by mouth daily. 90 tablet 2   No current facility-administered medications for this visit.    Allergies:   Patient has no known allergies.    Social History:   reports that he has never smoked. He has never used smokeless tobacco. He reports that he does not currently use alcohol. He reports that he does not currently use drugs.   Family History:  family history is not on file.    ROS:     Review of Systems  Constitutional: Negative.   HENT: Negative.     Eyes: Negative.   Respiratory: Negative.    Gastrointestinal: Negative.   Genitourinary: Negative.   Musculoskeletal: Negative.   Skin: Negative.   Neurological: Negative.   Endo/Heme/Allergies: Negative.   Psychiatric/Behavioral: Negative.    All other systems reviewed and are negative.     All other systems are reviewed and negative.    PHYSICAL EXAM: VS:  BP (!) 172/88   Pulse 65   Ht 6' 2 (1.88 m)   Wt 230 lb (104.3 kg)   SpO2 97%   BMI 29.53 kg/m  , BMI Body mass index is 29.53 kg/m. Last weight:  Wt Readings from Last 3 Encounters:  06/23/24 230 lb (104.3 kg)  05/12/24 231 lb 6.4 oz (105 kg)  04/21/24 233 lb 6.4 oz (105.9 kg)     Physical Exam Vitals reviewed.  Constitutional:      Appearance: Normal appearance. He is normal weight.  HENT:     Head: Normocephalic.     Nose: Nose normal.     Mouth/Throat:     Mouth: Mucous membranes are moist.  Eyes:     Pupils: Pupils are equal, round, and reactive to light.  Cardiovascular:     Rate and Rhythm: Normal rate and regular rhythm.     Pulses: Normal pulses.     Heart sounds: Normal heart sounds.  Pulmonary:     Effort: Pulmonary effort is normal.  Abdominal:     General: Abdomen is flat. Bowel sounds are normal.  Musculoskeletal:        General: Normal range of motion.     Cervical back: Normal range of motion.  Skin:    General: Skin is warm.  Neurological:     General: No focal deficit present.     Mental Status: He is alert.  Psychiatric:        Mood and Affect: Mood normal.       EKG:   Recent Labs: 06/17/2024: ALT 14; BUN 18; Creatinine, Ser 1.19; Hemoglobin CANCELED; Platelets CANCELED; Potassium 4.1; Sodium 139; TSH 7.330    Lipid Panel    Component Value Date/Time   CHOL 150 06/17/2024 0941   TRIG 75 06/17/2024 0941   HDL 39 (L) 06/17/2024 0941   CHOLHDL 3.8 06/17/2024 0941   LDLCALC 96 06/17/2024 0941      Other studies Reviewed: Additional studies/ records that were  reviewed today include:  Review of the above records demonstrates:       No data to display            ASSESSMENT AND PLAN:    ICD-10-CM   1. History of PTCA  Z98.61 lisinopril  (ZESTRIL ) 30 MG tablet    rosuvastatin  (CRESTOR ) 40 MG tablet    2. Hyperlipidemia, mixed  E78.2 lisinopril  (ZESTRIL ) 30 MG tablet    rosuvastatin  (CRESTOR ) 40 MG tablet    3. Essential hypertension  I10 lisinopril  (ZESTRIL ) 30 MG tablet    rosuvastatin  (CRESTOR ) 40 MG tablet   BP very high, increase dosage lisinopril .    4. SOB (shortness of breath)  R06.02 lisinopril  (ZESTRIL ) 30 MG tablet    rosuvastatin  (CRESTOR ) 40 MG tablet    5. Obstructive sleep apnea  G47.33 lisinopril  (ZESTRIL ) 30 MG tablet    rosuvastatin  (CRESTOR ) 40 MG tablet       Problem List Items Addressed This Visit       Cardiovascular and Mediastinum   Essential hypertension (Chronic)   Relevant Medications   lisinopril  (ZESTRIL ) 30 MG tablet   rosuvastatin  (CRESTOR ) 40 MG tablet     Respiratory   Obstructive sleep apnea   Relevant Medications   lisinopril  (ZESTRIL ) 30 MG tablet   rosuvastatin  (CRESTOR ) 40 MG tablet     Other   Hyperlipidemia, mixed (Chronic)   Relevant Medications   lisinopril  (ZESTRIL ) 30 MG tablet   rosuvastatin  (CRESTOR ) 40 MG tablet   SOB (shortness of breath)   Relevant Medications   lisinopril  (ZESTRIL ) 30 MG tablet   rosuvastatin  (CRESTOR ) 40 MG tablet   Other Visit Diagnoses       History of PTCA    -  Primary   Relevant Medications   lisinopril  (ZESTRIL ) 30 MG tablet   rosuvastatin  (CRESTOR ) 40 MG tablet          Disposition:   Return in about 4 weeks (around 07/21/2024).    Total time spent: 30 minutes  Signed,  Denyse Bathe, MD  06/23/2024 1:39 PM    [facility]

## 2024-06-24 ENCOUNTER — Ambulatory Visit: Admitting: Cardiology

## 2024-06-24 ENCOUNTER — Ambulatory Visit: Admitting: Cardiovascular Disease

## 2024-07-22 ENCOUNTER — Ambulatory Visit: Admitting: Cardiovascular Disease

## 2024-07-29 ENCOUNTER — Encounter: Payer: Self-pay | Admitting: Cardiovascular Disease

## 2024-07-29 ENCOUNTER — Ambulatory Visit (INDEPENDENT_AMBULATORY_CARE_PROVIDER_SITE_OTHER): Admitting: Cardiovascular Disease

## 2024-07-29 VITALS — BP 128/70 | HR 60 | Ht 74.0 in | Wt 228.0 lb

## 2024-07-29 DIAGNOSIS — E782 Mixed hyperlipidemia: Secondary | ICD-10-CM | POA: Diagnosis not present

## 2024-07-29 DIAGNOSIS — I25110 Atherosclerotic heart disease of native coronary artery with unstable angina pectoris: Secondary | ICD-10-CM

## 2024-07-29 DIAGNOSIS — I25118 Atherosclerotic heart disease of native coronary artery with other forms of angina pectoris: Secondary | ICD-10-CM

## 2024-07-29 DIAGNOSIS — I10 Essential (primary) hypertension: Secondary | ICD-10-CM | POA: Diagnosis not present

## 2024-07-29 DIAGNOSIS — R0602 Shortness of breath: Secondary | ICD-10-CM

## 2024-07-29 MED ORDER — LOSARTAN POTASSIUM 100 MG PO TABS: 100.0000 mg | ORAL_TABLET | Freq: Every day | ORAL | 11 refills | Status: AC

## 2024-07-29 NOTE — Progress Notes (Signed)
 Cardiology Office Note   Date:  07/29/2024   ID:  Jesse May, DOB 1956/07/03, MRN [id number]  PCP:  Carin Gauze, NP  Cardiologist:  Denyse Bathe, MD      History of Present Illness: Jesse May is a 68 y.o. male who presents for  Chief Complaint  Patient presents with   Follow-up    4 week follow up    SOB climbing stairs.      Past Medical History:  Diagnosis Date   MI (myocardial infarction) [facility])      Past Surgical History:  Procedure Laterality Date   APPENDECTOMY     CARDIAC CATHETERIZATION  2015   stent left circumflex-per Dr. KATHEE. [facility] note Care Everywhere   CHOLECYSTECTOMY     CORONARY BALLOON ANGIOPLASTY N/A 03/26/2023   Procedure: CORONARY BALLOON ANGIOPLASTY;  Surgeon: Anner Alm ORN, MD;  Location: [facility] INVASIVE CV LAB;  Service: Cardiovascular;  Laterality: N/A;   CORONARY IMAGING/OCT N/A 04/19/2024   Procedure: CORONARY IMAGING/OCT;  Surgeon: Mady Bruckner, MD;  Location: [facility] INVASIVE CV LAB;  Service: Cardiovascular;  Laterality: N/A;   CORONARY STENT INTERVENTION N/A 04/19/2024   Procedure: CORONARY STENT INTERVENTION;  Surgeon: Mady Bruckner, MD;  Location: [facility] INVASIVE CV LAB;  Service: Cardiovascular;  Laterality: N/A;   LEFT HEART CATH AND CORONARY ANGIOGRAPHY Left 03/26/2023   Procedure: LEFT HEART CATH AND CORONARY ANGIOGRAPHY;  Surgeon: Bathe Denyse DELENA, MD;  Location: [facility] INVASIVE CV LAB;  Service: Cardiovascular;  Laterality: Left;   LEFT HEART CATH AND CORONARY ANGIOGRAPHY Left 04/19/2024   Procedure: LEFT HEART CATH AND CORONARY ANGIOGRAPHY;  Surgeon: Bathe Denyse DELENA, MD;  Location: [facility] INVASIVE CV LAB;  Service: Cardiovascular;  Laterality: Left;   TONSILLECTOMY       Current Outpatient Medications  Medication Sig Dispense Refill   losartan  (COZAAR ) 100 MG tablet Take 1 tablet (100 mg total) by mouth daily. 30 tablet 11   amLODipine  (NORVASC ) 10 MG tablet Take 1 tablet (10 mg total) by mouth daily. 30 tablet 11   Aspirin   81 MG CAPS Take 81 mg by mouth daily.     clopidogrel  (PLAVIX ) 75 MG tablet Take 75 mg by mouth daily.     esomeprazole  (NEXIUM ) 40 MG capsule Take 1 capsule by mouth once daily 60 capsule 0   furosemide  (LASIX ) 20 MG tablet Take 1 tablet (20 mg total) by mouth daily. 30 tablet 11   isosorbide  mononitrate (IMDUR ) 30 MG 24 hr tablet Take 1 tablet (30 mg total) by mouth 2 (two) times daily. 30 tablet 1   ketorolac  (TORADOL ) 10 MG tablet Take 1 tablet (10 mg total) by mouth every 6 (six) hours as needed. 20 tablet 0   levothyroxine  (SYNTHROID ) 137 MCG tablet TAKE 1 TABLET BY MOUTH DAILY BEFORE BREAKFAST 90 tablet 0   metoprolol  succinate (TOPROL  XL) 50 MG 24 hr tablet Take 1 tablet (50 mg total) by mouth daily. Take with or immediately following a meal. 30 tablet 11   nitroGLYCERIN  (NITROSTAT ) 0.4 MG SL tablet DISSOLVE ONE TABLET UNDER THE TONGUE EVERY 5 MINUTES AS NEEDED FOR CHEST PAIN.  DO NOT EXCEED A TOTAL OF 3 DOSES IN 15 MINUTES 100 tablet 0   pantoprazole  (PROTONIX ) 40 MG tablet Take 40 mg by mouth daily as needed (acid reflux).     potassium chloride  (KLOR-CON ) 10 MEQ tablet Take 1 tablet by mouth twice daily 30 tablet 0   ranolazine  (RANEXA ) 1000 MG SR tablet Take 1 tablet (1,000 mg  total) by mouth 2 (two) times daily. 60 tablet 2   rosuvastatin  (CRESTOR ) 40 MG tablet Take 1 tablet (40 mg total) by mouth daily. 90 tablet 2   traZODone  (DESYREL ) 100 MG tablet TAKE 1 TABLET BY MOUTH NIGHTLY AT BEDTIME AS NEEDED FOR SLEEP (NOTE  INCREASED  DOSAGE) 90 tablet 0   No current facility-administered medications for this visit.    Allergies:   Patient has no known allergies.    Social History:   reports that he has never smoked. He has never used smokeless tobacco. He reports that he does not currently use alcohol. He reports that he does not currently use drugs.   Family History:  family history is not on file.    ROS:     Review of Systems  Constitutional: Negative.   HENT: Negative.     Eyes: Negative.   Respiratory: Negative.    Gastrointestinal: Negative.   Genitourinary: Negative.   Musculoskeletal: Negative.   Skin: Negative.   Neurological: Negative.   Endo/Heme/Allergies: Negative.   Psychiatric/Behavioral: Negative.    All other systems reviewed and are negative.     All other systems are reviewed and negative.    PHYSICAL EXAM: VS:  BP 128/70   Pulse 60   Ht 6' 2 (1.88 m)   Wt 228 lb (103.4 kg)   SpO2 98%   BMI 29.27 kg/m  , BMI Body mass index is 29.27 kg/m. Last weight:  Wt Readings from Last 3 Encounters:  07/29/24 228 lb (103.4 kg)  06/23/24 230 lb (104.3 kg)  05/12/24 231 lb 6.4 oz (105 kg)     Physical Exam Vitals reviewed.  Constitutional:      Appearance: Normal appearance. He is normal weight.  HENT:     Head: Normocephalic.     Nose: Nose normal.     Mouth/Throat:     Mouth: Mucous membranes are moist.  Eyes:     Pupils: Pupils are equal, round, and reactive to light.  Cardiovascular:     Rate and Rhythm: Normal rate and regular rhythm.     Pulses: Normal pulses.     Heart sounds: Normal heart sounds.  Pulmonary:     Effort: Pulmonary effort is normal.  Abdominal:     General: Abdomen is flat. Bowel sounds are normal.  Musculoskeletal:        General: Normal range of motion.     Cervical back: Normal range of motion.  Skin:    General: Skin is warm.  Neurological:     General: No focal deficit present.     Mental Status: He is alert.  Psychiatric:        Mood and Affect: Mood normal.       EKG:   Recent Labs: 06/17/2024: ALT 14; BUN 18; Creatinine, Ser 1.19; Hemoglobin CANCELED; Platelets CANCELED; Potassium 4.1; Sodium 139; TSH 7.330    Lipid Panel    Component Value Date/Time   CHOL 150 06/17/2024 0941   TRIG 75 06/17/2024 0941   HDL 39 (L) 06/17/2024 0941   CHOLHDL 3.8 06/17/2024 0941   LDLCALC 96 06/17/2024 0941      Other studies Reviewed: Additional studies/ records that were reviewed today  include:  Review of the above records demonstrates:       No data to display            ASSESSMENT AND PLAN:    ICD-10-CM   1. Coronary artery disease involving native coronary artery of native heart  with other form of angina pectoris  I25.118 losartan  (COZAAR ) 100 MG tablet    2. Coronary artery disease involving native coronary artery of native heart with unstable angina pectoris (HCC)  I25.110 losartan  (COZAAR ) 100 MG tablet    3. Essential hypertension  I10 losartan  (COZAAR ) 100 MG tablet    4. SOB (shortness of breath)  R06.02 losartan  (COZAAR ) 100 MG tablet   No chest pains, but has cough dry. Change lisinopril  30 to losartan  100 .    5. Hyperlipidemia, mixed  E78.2 losartan  (COZAAR ) 100 MG tablet       Problem List Items Addressed This Visit       Cardiovascular and Mediastinum   Essential hypertension (Chronic)   Relevant Medications   losartan  (COZAAR ) 100 MG tablet   Coronary artery disease - Primary   Relevant Medications   losartan  (COZAAR ) 100 MG tablet   Coronary artery disease involving native coronary artery of native heart with unstable angina pectoris (HCC)   Relevant Medications   losartan  (COZAAR ) 100 MG tablet     Other   Hyperlipidemia, mixed (Chronic)   Relevant Medications   losartan  (COZAAR ) 100 MG tablet   SOB (shortness of breath)   Relevant Medications   losartan  (COZAAR ) 100 MG tablet       Disposition:   Return in about 4 weeks (around 08/26/2024).    Total time spent: 35 minutes  Signed,  Denyse Bathe, MD  07/29/2024 11:47 AM    [facility]

## 2024-08-09 ENCOUNTER — Encounter: Payer: Self-pay | Admitting: Cardiology

## 2024-08-09 ENCOUNTER — Ambulatory Visit (INDEPENDENT_AMBULATORY_CARE_PROVIDER_SITE_OTHER): Admitting: Cardiology

## 2024-08-09 VITALS — BP 126/81 | HR 54 | Ht 74.0 in | Wt 235.2 lb

## 2024-08-09 DIAGNOSIS — J0100 Acute maxillary sinusitis, unspecified: Secondary | ICD-10-CM | POA: Diagnosis not present

## 2024-08-09 DIAGNOSIS — Z0130 Encounter for examination of blood pressure without abnormal findings: Secondary | ICD-10-CM

## 2024-08-09 DIAGNOSIS — Z131 Encounter for screening for diabetes mellitus: Secondary | ICD-10-CM

## 2024-08-09 MED ORDER — AMOXICILLIN-POT CLAVULANATE 875-125 MG PO TABS: 1.0000 | ORAL_TABLET | Freq: Two times a day (BID) | ORAL | 0 refills | Status: AC

## 2024-08-09 MED ORDER — FLUTICASONE PROPIONATE 50 MCG/ACT NA SUSP: 1.0000 | Freq: Every day | NASAL | 2 refills | Status: AC

## 2024-08-09 NOTE — Progress Notes (Signed)
 Established Patient Office Visit  Subjective:  Patient ID: Jesse May, male    DOB: 19-Sep-1956  Age: 68 y.o. MRN: [id number]  Chief Complaint  Patient presents with   Acute Visit    Poss sinus infection. Right side headache, uneasy stomach. Symptoms started 3 days ago.     Patient in office for an acute visit, thinks he may have a sinus infection. Symptoms started 3 days ago. Patient complaining of right sided headache, sinus pressure, nasal congestion and cough. Patient has been using Tylenol  sinus with no relief. Will send in Augmentin , Flonase . Drink plenty of water. Patient states he is unable to take Mucinex.   Sinus Problem This is a new problem. The current episode started in the past 7 days. The problem is unchanged. There has been no fever. Associated symptoms include congestion, coughing, headaches and sinus pressure. Pertinent negatives include no shortness of breath. Past treatments include acetaminophen  (Tylenol  sinus). The treatment provided no relief.    No other concerns at this time.   Past Medical History:  Diagnosis Date   MI (myocardial infarction) [facility])     Past Surgical History:  Procedure Laterality Date   APPENDECTOMY     CARDIAC CATHETERIZATION  2015   stent left circumflex-per Dr. KATHEE. [facility] note Care Everywhere   CHOLECYSTECTOMY     CORONARY BALLOON ANGIOPLASTY N/A 03/26/2023   Procedure: CORONARY BALLOON ANGIOPLASTY;  Surgeon: Anner Alm ORN, MD;  Location: [facility] INVASIVE CV LAB;  Service: Cardiovascular;  Laterality: N/A;   CORONARY IMAGING/OCT N/A 04/19/2024   Procedure: CORONARY IMAGING/OCT;  Surgeon: Mady Bruckner, MD;  Location: [facility] INVASIVE CV LAB;  Service: Cardiovascular;  Laterality: N/A;   CORONARY STENT INTERVENTION N/A 04/19/2024   Procedure: CORONARY STENT INTERVENTION;  Surgeon: Mady Bruckner, MD;  Location: [facility] INVASIVE CV LAB;  Service: Cardiovascular;  Laterality: N/A;   LEFT HEART CATH AND CORONARY ANGIOGRAPHY Left 03/26/2023    Procedure: LEFT HEART CATH AND CORONARY ANGIOGRAPHY;  Surgeon: Fernand Denyse DELENA, MD;  Location: [facility] INVASIVE CV LAB;  Service: Cardiovascular;  Laterality: Left;   LEFT HEART CATH AND CORONARY ANGIOGRAPHY Left 04/19/2024   Procedure: LEFT HEART CATH AND CORONARY ANGIOGRAPHY;  Surgeon: Fernand Denyse DELENA, MD;  Location: [facility] INVASIVE CV LAB;  Service: Cardiovascular;  Laterality: Left;   TONSILLECTOMY      Social History   Socioeconomic History   Marital status: Divorced    Spouse name: Not on file   Number of children: Not on file   Years of education: Not on file   Highest education level: Not on file  Occupational History   Not on file  Tobacco Use   Smoking status: Never   Smokeless tobacco: Never  Vaping Use   Vaping status: Never Used  Substance and Sexual Activity   Alcohol use: Not Currently   Drug use: Not Currently   Sexual activity: Not on file  Other Topics Concern   Not on file  Social History Narrative   Lives alone.  Alyse Arland Arts, here today. Has pet, dog.    Social Drivers of Corporate investment banker Strain: Not on file  Food Insecurity: No Food Insecurity (04/19/2024)   Hunger Vital Sign    Worried About Running Out of Food in the Last Year: Never true    Ran Out of Food in the Last Year: Never true  Transportation Needs: No Transportation Needs (04/19/2024)   PRAPARE - Administrator, Civil Service (Medical): No  Lack of Transportation (Non-Medical): No  Physical Activity: Not on file  Stress: Not on file  Social Connections: Socially Isolated (04/19/2024)   Social Connection and Isolation Panel    Frequency of Communication with Friends and Family: More than three times a week    Frequency of Social Gatherings with Friends and Family: More than three times a week    Attends Religious Services: Never    Database administrator or Organizations: No    Attends Banker Meetings: Never    Marital Status: Divorced  Careers information officer Violence: Not At Risk (04/19/2024)   Humiliation, Afraid, Rape, and Kick questionnaire    Fear of Current or Ex-Partner: No    Emotionally Abused: No    Physically Abused: No    Sexually Abused: No    History reviewed. No pertinent family history.  No Known Allergies  Outpatient Medications Prior to Visit  Medication Sig   amLODipine  (NORVASC ) 10 MG tablet Take 1 tablet (10 mg total) by mouth daily.   Aspirin  81 MG CAPS Take 81 mg by mouth daily.   clopidogrel  (PLAVIX ) 75 MG tablet Take 75 mg by mouth daily.   esomeprazole  (NEXIUM ) 40 MG capsule Take 1 capsule by mouth once daily   furosemide  (LASIX ) 20 MG tablet Take 1 tablet (20 mg total) by mouth daily.   isosorbide  mononitrate (IMDUR ) 30 MG 24 hr tablet Take 1 tablet (30 mg total) by mouth 2 (two) times daily.   ketorolac  (TORADOL ) 10 MG tablet Take 1 tablet (10 mg total) by mouth every 6 (six) hours as needed.   levothyroxine  (SYNTHROID ) 137 MCG tablet TAKE 1 TABLET BY MOUTH DAILY BEFORE BREAKFAST   losartan  (COZAAR ) 100 MG tablet Take 1 tablet (100 mg total) by mouth daily.   metoprolol  succinate (TOPROL  XL) 50 MG 24 hr tablet Take 1 tablet (50 mg total) by mouth daily. Take with or immediately following a meal.   nitroGLYCERIN  (NITROSTAT ) 0.4 MG SL tablet DISSOLVE ONE TABLET UNDER THE TONGUE EVERY 5 MINUTES AS NEEDED FOR CHEST PAIN.  DO NOT EXCEED A TOTAL OF 3 DOSES IN 15 MINUTES   pantoprazole  (PROTONIX ) 40 MG tablet Take 40 mg by mouth daily as needed (acid reflux).   potassium chloride  (KLOR-CON ) 10 MEQ tablet Take 1 tablet by mouth twice daily   ranolazine  (RANEXA ) 1000 MG SR tablet Take 1 tablet (1,000 mg total) by mouth 2 (two) times daily.   rosuvastatin  (CRESTOR ) 40 MG tablet Take 1 tablet (40 mg total) by mouth daily.   traZODone  (DESYREL ) 100 MG tablet TAKE 1 TABLET BY MOUTH NIGHTLY AT BEDTIME AS NEEDED FOR SLEEP (NOTE  INCREASED  DOSAGE)   No facility-administered medications prior to visit.    Review of  Systems  Constitutional: Negative.   HENT:  Positive for congestion, sinus pressure and sinus pain.   Eyes: Negative.   Respiratory:  Positive for cough and sputum production. Negative for shortness of breath.   Cardiovascular: Negative.  Negative for chest pain.  Gastrointestinal: Negative.  Negative for abdominal pain, constipation and diarrhea.  Genitourinary: Negative.   Musculoskeletal:  Negative for joint pain and myalgias.  Skin: Negative.   Neurological:  Positive for headaches. Negative for dizziness.  Endo/Heme/Allergies: Negative.   All other systems reviewed and are negative.      Objective:   BP 126/81   Pulse (!) 54   Ht 6' 2 (1.88 m)   Wt 235 lb 3.2 oz (106.7 kg)   SpO2 96%  BMI 30.20 kg/m   Vitals:   08/09/24 1005  BP: 126/81  Pulse: (!) 54  Height: 6' 2 (1.88 m)  Weight: 235 lb 3.2 oz (106.7 kg)  SpO2: 96%  BMI (Calculated): 30.19    Physical Exam Nursing note reviewed.  Constitutional:      Appearance: Normal appearance. He is normal weight.  HENT:     Head: Normocephalic and atraumatic.     Nose: Nose normal.     Mouth/Throat:     Mouth: Mucous membranes are moist.     Pharynx: Oropharynx is clear.  Eyes:     Extraocular Movements: Extraocular movements intact.     Conjunctiva/sclera: Conjunctivae normal.     Pupils: Pupils are equal, round, and reactive to light.  Cardiovascular:     Rate and Rhythm: Normal rate and regular rhythm.     Pulses: Normal pulses.     Heart sounds: Normal heart sounds.  Pulmonary:     Effort: Pulmonary effort is normal.     Breath sounds: Normal breath sounds.  Abdominal:     General: Abdomen is flat. Bowel sounds are normal.     Palpations: Abdomen is soft.  Musculoskeletal:        General: Normal range of motion.     Cervical back: Normal range of motion.  Skin:    General: Skin is warm and dry.  Neurological:     General: No focal deficit present.     Mental Status: He is alert and oriented to  person, place, and time.  Psychiatric:        Mood and Affect: Mood normal.        Behavior: Behavior normal.        Thought Content: Thought content normal.        Judgment: Judgment normal.      No results found for any visits on 08/09/24.  Recent Results (from the past 2160 hours)  CBC with Diff     Status: None   Collection Time: 06/17/24  9:41 AM  Result Value Ref Range   WBC CANCELED x10E3/uL    Comment: Test not performed. Deterioration occurred during specimen handling. [facility] is providing the patient with re-collection instructions.  Result canceled by the ancillary.    RBC CANCELED     Comment: Test not performed  Result canceled by the ancillary.    Hemoglobin CANCELED     Comment: Test not performed  Result canceled by the ancillary.    Hematocrit CANCELED     Comment: Test not performed  Result canceled by the ancillary.    Platelets CANCELED     Comment: Test not performed  Result canceled by the ancillary.    Neutrophils CANCELED     Comment: Test not performed  Result canceled by the ancillary.    Lymphs CANCELED     Comment: Test not performed  Result canceled by the ancillary.    Monocytes CANCELED     Comment: Test not performed  Result canceled by the ancillary.    Eos CANCELED     Comment: Test not performed  Result canceled by the ancillary.    Lymphocytes Absolute CANCELED     Comment: Test not performed  Result canceled by the ancillary.    EOS (ABSOLUTE) CANCELED     Comment: Test not performed  Result canceled by the ancillary.    Basophils Absolute CANCELED     Comment: Test not performed  Result canceled by the ancillary.  Hemoglobin A1c     Status: None   Collection Time: 06/17/24  9:41 AM  Result Value Ref Range   Hgb A1c MFr Bld 5.5 4.8 - 5.6 %    Comment:          Prediabetes: 5.7 - 6.4          Diabetes: >6.4          Glycemic control for adults with diabetes: <7.0    Est. average glucose Bld gHb  Est-mCnc 111 mg/dL  TSH     Status: Abnormal   Collection Time: 06/17/24  9:41 AM  Result Value Ref Range   TSH 7.330 (H) 0.450 - 4.500 uIU/mL  CMP14+EGFR     Status: Abnormal   Collection Time: 06/17/24  9:41 AM  Result Value Ref Range   Glucose 100 (H) 70 - 99 mg/dL   BUN 18 8 - 27 mg/dL   Creatinine, Ser 8.80 0.76 - 1.27 mg/dL   eGFR 66 >40 fO/fpw/8.26   BUN/Creatinine Ratio 15 10 - 24   Sodium 139 134 - 144 mmol/L   Potassium 4.1 3.5 - 5.2 mmol/L   Chloride 101 96 - 106 mmol/L   CO2 16 (L) 20 - 29 mmol/L   Calcium  9.2 8.6 - 10.2 mg/dL   Total Protein 6.3 6.0 - 8.5 g/dL   Albumin 4.6 3.9 - 4.9 g/dL   Globulin, Total 1.7 1.5 - 4.5 g/dL   Bilirubin Total 0.5 0.0 - 1.2 mg/dL   Alkaline Phosphatase 70 44 - 121 IU/L    Comment: **Effective July 04, 2024 Alkaline Phosphatase**   reference interval will be changing to:              Age                Male          Male           0 -  5 days         47 - 127       47 - 127           6 - 10 days         29 - 242       29 - 242          11 - 20 days        109 - 357      109 - 357          21 - 30 days         94 - 494       94 - 494           1 -  2 months      149 - 539      149 - 539           3 -  6 months      131 - 452      131 - 452           7 - 11 months      117 - 401      117 - 401   12 months -  6 years       158 - 369      158 - 369           7 - 12 years       150 - 409  150 - 409               13 years       156 - 435       78 - 227               14 years       114 - 375       64 - 161               15 years        88 - 279       56 - 134               16 years        74 - 207       51 - 121               17 years        63 - 161       47 - 113          18 - 20 years        51 - 125       42 - 106          21 - 50 years         47 - 123       41 - 116          51 - 80 years        49 - 135       51 - 125              >80 years        48 - 129       48 - 129    AST 18 0 - 40 IU/L   ALT 14 0 - 44 IU/L   Lipid panel     Status: Abnormal   Collection Time: 06/17/24  9:41 AM  Result Value Ref Range   Cholesterol, Total 150 100 - 199 mg/dL   Triglycerides 75 0 - 149 mg/dL   HDL 39 (L) >60 mg/dL   VLDL Cholesterol Cal 15 5 - 40 mg/dL   LDL Chol Calc (NIH) 96 0 - 99 mg/dL   Chol/HDL Ratio 3.8 0.0 - 5.0 ratio    Comment:                                   T. Chol/HDL Ratio                                             Men  Women                               1/2 Avg.Risk  3.4    3.3                                   Avg.Risk  5.0    4.4                                2X  Avg.Risk  9.6    7.1                                3X Avg.Risk 23.4   11.0   Testosterone ,Free and Total     Status: Abnormal   Collection Time: 06/17/24  9:41 AM  Result Value Ref Range   Testosterone  >1500 (H) 264 - 916 ng/dL    Comment: Adult male reference interval is based on a population of healthy nonobese males (BMI <30) between 72 and 35 years old. Travison, et.al. JCEM [phone]. PMID: [id number].    Testosterone , Free 32.6 (H) 6.6 - 18.1 pg/mL      Assessment & Plan:  Augmentin  Flonase  Drink plenty of water  Problem List Items Addressed This Visit       Respiratory   Acute non-recurrent maxillary sinusitis - Primary   Relevant Medications   amoxicillin -clavulanate (AUGMENTIN ) 875-125 MG tablet   fluticasone  (FLONASE ) 50 MCG/ACT nasal spray    Return if symptoms worsen or fail to improve.   Total time spent: 25 minutes. This time includes review of previous notes and results and patient face to face interaction during today's visit.    Jeoffrey Pollen, NP  08/09/2024   This document may have been prepared by Dragon Voice Recognition software and as such may include unintentional dictation errors.

## 2024-08-17 ENCOUNTER — Other Ambulatory Visit: Payer: Self-pay | Admitting: Cardiology

## 2024-08-26 ENCOUNTER — Ambulatory Visit: Admitting: Cardiology

## 2024-09-01 ENCOUNTER — Ambulatory Visit: Admitting: Cardiology

## 2024-09-07 ENCOUNTER — Other Ambulatory Visit: Payer: Self-pay | Admitting: Cardiovascular Disease

## 2024-09-07 DIAGNOSIS — I10 Essential (primary) hypertension: Secondary | ICD-10-CM

## 2024-09-07 DIAGNOSIS — I209 Angina pectoris, unspecified: Secondary | ICD-10-CM

## 2024-09-07 DIAGNOSIS — I25119 Atherosclerotic heart disease of native coronary artery with unspecified angina pectoris: Secondary | ICD-10-CM

## 2024-09-07 DIAGNOSIS — I25118 Atherosclerotic heart disease of native coronary artery with other forms of angina pectoris: Secondary | ICD-10-CM

## 2024-09-07 DIAGNOSIS — E782 Mixed hyperlipidemia: Secondary | ICD-10-CM

## 2024-09-07 DIAGNOSIS — R0602 Shortness of breath: Secondary | ICD-10-CM

## 2024-09-16 ENCOUNTER — Other Ambulatory Visit: Payer: Self-pay | Admitting: Cardiology

## 2024-09-19 ENCOUNTER — Ambulatory Visit: Admitting: Cardiology

## 2024-09-23 ENCOUNTER — Ambulatory Visit: Admitting: Cardiology
